# Patient Record
Sex: Male | Born: 1965 | Race: White | Hispanic: No | Smoking: Former smoker
Health system: Southern US, Community
[De-identification: ages and names within clinical notes are randomized; demographics above are authoritative.]

## PROBLEM LIST (undated history)

## (undated) DIAGNOSIS — R519 Headache, unspecified: Secondary | ICD-10-CM

## (undated) DIAGNOSIS — R51 Headache: Secondary | ICD-10-CM

## (undated) HISTORY — PX: ROTATOR CUFF REPAIR: SHX139

## (undated) HISTORY — PX: LEG SURGERY: SHX1003

## (undated) HISTORY — PX: ANTERIOR CRUCIATE LIGAMENT REPAIR: SHX115

## (undated) HISTORY — PX: HERNIA REPAIR: SHX51

---

## 2000-10-02 ENCOUNTER — Encounter: Payer: Self-pay | Admitting: Family Medicine

## 2000-10-02 ENCOUNTER — Encounter: Admission: RE | Admit: 2000-10-02 | Discharge: 2000-10-02 | Payer: Self-pay | Admitting: Family Medicine

## 2002-05-11 ENCOUNTER — Emergency Department (HOSPITAL_COMMUNITY): Admission: EM | Admit: 2002-05-11 | Discharge: 2002-05-11 | Payer: Self-pay | Admitting: Emergency Medicine

## 2002-05-11 ENCOUNTER — Encounter: Payer: Self-pay | Admitting: Emergency Medicine

## 2009-01-10 ENCOUNTER — Emergency Department (HOSPITAL_COMMUNITY): Admission: EM | Admit: 2009-01-10 | Discharge: 2009-01-10 | Payer: Self-pay | Admitting: Emergency Medicine

## 2010-11-03 ENCOUNTER — Emergency Department (HOSPITAL_COMMUNITY)
Admission: EM | Admit: 2010-11-03 | Discharge: 2010-11-03 | Payer: Self-pay | Source: Home / Self Care | Admitting: Emergency Medicine

## 2011-01-16 LAB — CBC
Hemoglobin: 15.5 g/dL (ref 13.0–17.0)
RBC: 4.91 MIL/uL (ref 4.22–5.81)
WBC: 9.7 10*3/uL (ref 4.0–10.5)

## 2011-01-16 LAB — DIFFERENTIAL
Basophils Relative: 1 % (ref 0–1)
Lymphocytes Relative: 33 % (ref 12–46)
Lymphs Abs: 3.2 10*3/uL (ref 0.7–4.0)
Monocytes Relative: 9 % (ref 3–12)
Neutro Abs: 5.3 10*3/uL (ref 1.7–7.7)
Neutrophils Relative %: 55 % (ref 43–77)

## 2011-01-16 LAB — BASIC METABOLIC PANEL
Calcium: 9.3 mg/dL (ref 8.4–10.5)
GFR calc Af Amer: >60 mL/min (ref >60)
GFR calc non Af Amer: >60 mL/min (ref >60)
Sodium: 139 mEq/L (ref 135–145)

## 2011-02-16 LAB — URINALYSIS, ROUTINE W REFLEX MICROSCOPIC
Glucose, UA: NEGATIVE mg/dL
Specific Gravity, Urine: 1.018 (ref 1.005–1.030)
Urobilinogen, UA: 0.2 mg/dL (ref 0.0–1.0)

## 2011-02-16 LAB — POCT I-STAT, CHEM 8
Glucose, Bld: 92 mg/dL (ref 70–99)
HCT: 53 % — ABNORMAL HIGH (ref 39.0–52.0)
Hemoglobin: 18 g/dL — ABNORMAL HIGH (ref 13.0–17.0)
Potassium: 4 mEq/L (ref 3.5–5.1)

## 2011-02-16 LAB — URINE MICROSCOPIC-ADD ON

## 2015-02-07 ENCOUNTER — Emergency Department (HOSPITAL_BASED_OUTPATIENT_CLINIC_OR_DEPARTMENT_OTHER): Payer: Self-pay

## 2015-02-07 ENCOUNTER — Emergency Department (HOSPITAL_BASED_OUTPATIENT_CLINIC_OR_DEPARTMENT_OTHER)
Admission: EM | Admit: 2015-02-07 | Discharge: 2015-02-07 | Disposition: A | Discharge status: Home / Self Care | Payer: Self-pay | Attending: Emergency Medicine | Admitting: Emergency Medicine

## 2015-02-07 DIAGNOSIS — N200 Calculus of kidney: Secondary | ICD-10-CM | POA: Insufficient documentation

## 2015-02-07 DIAGNOSIS — N390 Urinary tract infection, site not specified: Secondary | ICD-10-CM | POA: Insufficient documentation

## 2015-02-07 DIAGNOSIS — R109 Unspecified abdominal pain: Secondary | ICD-10-CM

## 2015-02-07 LAB — COMPREHENSIVE METABOLIC PANEL
ALT: 15 U/L (ref 0–53)
AST: 16 U/L (ref 0–37)
Albumin: 3.1 g/dL — ABNORMAL LOW (ref 3.5–5.2)
Alkaline Phosphatase: 69 U/L (ref 39–117)
Anion gap: 6 (ref 5–15)
BUN: 24 mg/dL — AB (ref 6–23)
CALCIUM: 6.9 mg/dL — AB (ref 8.4–10.5)
CO2: 19 mmol/L (ref 19–32)
Chloride: 113 mmol/L — ABNORMAL HIGH (ref 96–112)
Creatinine, Ser: 0.97 mg/dL (ref 0.50–1.35)
GLUCOSE: 84 mg/dL (ref 70–99)
Potassium: 3.2 mmol/L — ABNORMAL LOW (ref 3.5–5.1)
SODIUM: 138 mmol/L (ref 135–145)
Total Bilirubin: 0.4 mg/dL (ref 0.3–1.2)
Total Protein: 5.5 g/dL — ABNORMAL LOW (ref 6.0–8.3)

## 2015-02-07 LAB — URINALYSIS, ROUTINE W REFLEX MICROSCOPIC
Bilirubin Urine: NEGATIVE
Glucose, UA: NEGATIVE mg/dL
KETONES UR: NEGATIVE mg/dL
Nitrite: NEGATIVE
Protein, ur: NEGATIVE mg/dL
Specific Gravity, Urine: 1.021 (ref 1.005–1.030)
Urobilinogen, UA: 0.2 mg/dL (ref 0.0–1.0)
pH: 5.5 (ref 5.0–8.0)

## 2015-02-07 LAB — CBC WITH DIFFERENTIAL/PLATELET
BASOS ABS: 0 10*3/uL (ref 0.0–0.1)
BASOS PCT: 0 % (ref 0–1)
Eosinophils Absolute: 0.1 10*3/uL (ref 0.0–0.7)
Eosinophils Relative: 1 % (ref 0–5)
HCT: 35.1 % — ABNORMAL LOW (ref 39.0–52.0)
Hemoglobin: 12.2 g/dL — ABNORMAL LOW (ref 13.0–17.0)
LYMPHS ABS: 1.6 10*3/uL (ref 0.7–4.0)
LYMPHS PCT: 15 % (ref 12–46)
MCH: 32.1 pg (ref 26.0–34.0)
MCHC: 34.8 g/dL (ref 30.0–36.0)
MCV: 92.4 fL (ref 78.0–100.0)
MONO ABS: 1.1 10*3/uL — AB (ref 0.1–1.0)
MONOS PCT: 10 % (ref 3–12)
NEUTROS PCT: 74 % (ref 43–77)
Neutro Abs: 8.2 10*3/uL — ABNORMAL HIGH (ref 1.7–7.7)
PLATELETS: 196 10*3/uL (ref 150–400)
RBC: 3.8 MIL/uL — ABNORMAL LOW (ref 4.22–5.81)
RDW: 13.2 % (ref 11.5–15.5)
WBC: 11 10*3/uL — AB (ref 4.0–10.5)

## 2015-02-07 LAB — LIPASE, BLOOD: LIPASE: 24 U/L (ref 11–59)

## 2015-02-07 LAB — URINE MICROSCOPIC-ADD ON

## 2015-02-07 MED ORDER — CIPROFLOXACIN IN D5W 400 MG/200ML IV SOLN
400.0000 mg | Freq: Once | INTRAVENOUS | Status: AC
  Administered 2015-02-07: 400 mg via INTRAVENOUS
  Filled 2015-02-07: qty 200

## 2015-02-07 MED ORDER — ONDANSETRON HCL 4 MG/2ML IJ SOLN
4.0000 mg | Freq: Once | INTRAMUSCULAR | Status: AC
  Administered 2015-02-07: 4 mg via INTRAVENOUS

## 2015-02-07 MED ORDER — ONDANSETRON 8 MG PO TBDP: 8.0000 mg | ORAL_TABLET | Freq: Three times a day (TID) | ORAL | Status: DC | PRN

## 2015-02-07 MED ORDER — TAMSULOSIN HCL 0.4 MG PO CAPS: 0.4000 mg | ORAL_CAPSULE | Freq: Every day | ORAL | Status: DC

## 2015-02-07 MED ORDER — CIPROFLOXACIN HCL 500 MG PO TABS: 500.0000 mg | ORAL_TABLET | Freq: Two times a day (BID) | ORAL | Status: DC

## 2015-02-07 MED ORDER — KETOROLAC TROMETHAMINE 30 MG/ML IJ SOLN
30.0000 mg | Freq: Once | INTRAMUSCULAR | Status: AC
  Administered 2015-02-07: 30 mg via INTRAVENOUS
  Filled 2015-02-07: qty 1

## 2015-02-07 MED ORDER — ONDANSETRON HCL 4 MG/2ML IJ SOLN
4.0000 mg | Freq: Once | INTRAMUSCULAR | Status: AC
  Administered 2015-02-07: 4 mg via INTRAVENOUS
  Filled 2015-02-07: qty 2

## 2015-02-07 MED ORDER — ONDANSETRON HCL 4 MG/2ML IJ SOLN
INTRAMUSCULAR | Status: AC
  Administered 2015-02-07: 4 mg via INTRAVENOUS
  Filled 2015-02-07: qty 2

## 2015-02-07 MED ORDER — HYDROMORPHONE HCL 1 MG/ML IJ SOLN
0.5000 mg | Freq: Once | INTRAMUSCULAR | Status: AC
  Administered 2015-02-07: 0.5 mg via INTRAVENOUS
  Filled 2015-02-07: qty 1

## 2015-02-07 MED ORDER — SODIUM CHLORIDE 0.9 % IV SOLN
Freq: Once | INTRAVENOUS | Status: AC
  Administered 2015-02-07: 12:00:00 via INTRAVENOUS

## 2015-02-07 MED ORDER — OXYCODONE-ACETAMINOPHEN 5-325 MG PO TABS: 1.0000 | ORAL_TABLET | ORAL | Status: DC | PRN

## 2015-02-07 MED ORDER — HYDROMORPHONE HCL 1 MG/ML IJ SOLN
1.0000 mg | Freq: Once | INTRAMUSCULAR | Status: AC
Start: 2015-02-07 — End: 2015-02-07
  Administered 2015-02-07: 1 mg via INTRAVENOUS
  Filled 2015-02-07: qty 1

## 2015-02-07 NOTE — ED Notes (Signed)
Cipro cont on IV infusion pump

## 2015-02-07 NOTE — Discharge Instructions (Signed)
Take percocet as prescribed as needed for pain. Flomax to help pass the stone. zofran for nausea. cipro for infection. If develop high fever, vomiting, worsening pain, go to [facility] ED. Otherwise follow up with urology  Kidney Stones Kidney stones (urolithiasis) are deposits that form inside your kidneys. The intense pain is caused by the stone moving through the urinary tract. When the stone moves, the ureter goes into spasm around the stone. The stone is usually passed in the urine.  CAUSES   A disorder that makes certain neck glands produce too much parathyroid hormone (primary hyperparathyroidism).  A buildup of uric acid crystals, similar to gout in your joints.  Narrowing (stricture) of the ureter.  A kidney obstruction present at birth (congenital obstruction).  Previous surgery on the kidney or ureters.  Numerous kidney infections. SYMPTOMS   Feeling sick to your stomach (nauseous).  Throwing up (vomiting).  Blood in the urine (hematuria).  Pain that usually spreads (radiates) to the groin.  Frequency or urgency of urination. DIAGNOSIS   Taking a history and physical exam.  Blood or urine tests.  CT scan.  Occasionally, an examination of the inside of the urinary bladder (cystoscopy) is performed. TREATMENT   Observation.  Increasing your fluid intake.  Extracorporeal shock wave lithotripsy--This is a noninvasive procedure that uses shock waves to break up kidney stones.  Surgery may be needed if you have severe pain or persistent obstruction. There are various surgical procedures. Most of the procedures are performed with the use of small instruments. Only small incisions are needed to accommodate these instruments, so recovery time is minimized. The size, location, and chemical composition are all important variables that will determine the proper choice of action for you. Talk to your health care provider to better understand your situation so that you  will minimize the risk of injury to yourself and your kidney.  HOME CARE INSTRUCTIONS   Drink enough water and fluids to keep your urine clear or pale yellow. This will help you to pass the stone or stone fragments.  Strain all urine through the provided strainer. Keep all particulate matter and stones for your health care provider to see. The stone causing the pain may be as small as a grain of salt. It is very important to use the strainer each and every time you pass your urine. The collection of your stone will allow your health care provider to analyze it and verify that a stone has actually passed. The stone analysis will often identify what you can do to reduce the incidence of recurrences.  Only take over-the-counter or prescription medicines for pain, discomfort, or fever as directed by your health care provider.  Make a follow-up appointment with your health care provider as directed.  Get follow-up X-rays if required. The absence of pain does not always mean that the stone has passed. It may have only stopped moving. If the urine remains completely obstructed, it can cause loss of kidney function or even complete destruction of the kidney. It is your responsibility to make sure X-rays and follow-ups are completed. Ultrasounds of the kidney can show blockages and the status of the kidney. Ultrasounds are not associated with any radiation and can be performed easily in a matter of minutes. SEEK MEDICAL CARE IF:  You experience pain that is progressive and unresponsive to any pain medicine you have been prescribed. SEEK IMMEDIATE MEDICAL CARE IF:   Pain cannot be controlled with the prescribed medicine.  You have a  fever or shaking chills.  The severity or intensity of pain increases over 18 hours and is not relieved by pain medicine.  You develop a new onset of abdominal pain.  You feel faint or pass out.  You are unable to urinate. MAKE SURE YOU:   Understand these  instructions.  Will watch your condition.  Will get help right away if you are not doing well or get worse. Document Released: 10/23/2005 Document Revised: 06/25/2013 Document Reviewed: 03/26/2013 [facility] Patient Information 2015 [place], [place]. This information is not intended to replace advice given to you by your health care provider. Make sure you discuss any questions you have with your health care provider.

## 2015-02-07 NOTE — ED Notes (Signed)
Patient transported to CT 

## 2015-02-07 NOTE — ED Provider Notes (Signed)
CSN: [id number]     Arrival date & time 02/07/15  1100 History   First MD Initiated Contact with Patient 02/07/15 1206     No chief complaint on file.    (Consider location/radiation/quality/duration/timing/severity/associated sxs/prior Treatment) HPI LISTON THUM is a 49 y.o. male with history of kidney stones, presents to emergency department complaining of left lower abdominal pain that radiates to the left flank pain for about a week. When the pain started it was not severe, and patient was hoping it would improve. States it is only gotten worse. He reports dysuria, hematuria, chills at home. He has not been taking any medications. Patient states he is having associated nausea. Denies vomiting. Denies diarrhea. He currently does not have a urologist. He has not had a kidney stone in multiple years. States nothing that he does make symptoms better or worse.     No past medical history on file. No past surgical history on file. No family history on file. History  Substance Use Topics  . Smoking status: Not on file  . Smokeless tobacco: Not on file  . Alcohol Use: Not on file    Review of Systems  Constitutional: Positive for chills. Negative for fever.  Respiratory: Negative for cough, chest tightness and shortness of breath.   Cardiovascular: Negative for chest pain, palpitations and leg swelling.  Gastrointestinal: Positive for nausea and abdominal pain. Negative for vomiting, diarrhea and abdominal distention.  Genitourinary: Positive for dysuria, urgency, hematuria and testicular pain. Negative for frequency, penile swelling and scrotal swelling.  Musculoskeletal: Negative for myalgias, arthralgias, neck pain and neck stiffness.  Skin: Negative for rash.  Allergic/Immunologic: Negative for immunocompromised state.  Neurological: Negative for dizziness, weakness, light-headedness, numbness and headaches.  All other systems reviewed and are negative.     Allergies  Review  of patient's allergies indicates no known allergies.  Home Medications   Prior to Admission medications   Not on File   BP 158/117 mmHg  Temp(Src) 98.4 F (36.9 C) (Oral)  Resp 24  Ht 5\' 7"  (1.702 m)  Wt 190 lb (86.183 kg)  BMI 29.75 kg/m2  SpO2 98% Physical Exam  Constitutional: He appears well-developed and well-nourished. No distress.  HENT:  Head: Normocephalic and atraumatic.  Eyes: Conjunctivae are normal.  Neck: Neck supple.  Cardiovascular: Normal rate, regular rhythm and normal heart sounds.   Pulmonary/Chest: Effort normal. No respiratory distress. He has no wheezes. He has no rales.  Abdominal: Soft. Bowel sounds are normal. He exhibits no distension. There is tenderness. There is no rebound.  LLQ tenderness. Left CVA tenderness  Musculoskeletal: He exhibits no edema.  Neurological: He is alert.  Skin: Skin is warm and dry.  Nursing note and vitals reviewed.   ED Course  Procedures (including critical care time) Labs Review Labs Reviewed  URINALYSIS, ROUTINE W REFLEX MICROSCOPIC - Abnormal; Notable for the following:    Hgb urine dipstick LARGE (*)    Leukocytes, UA SMALL (*)    All other components within normal limits  URINE MICROSCOPIC-ADD ON - Abnormal; Notable for the following:    Bacteria, UA MANY (*)    Crystals CA OXALATE CRYSTALS (*)    All other components within normal limits  CBC WITH DIFFERENTIAL/PLATELET  COMPREHENSIVE METABOLIC PANEL  LIPASE, BLOOD    Imaging Review Ct Renal Stone Study  02/07/2015   CLINICAL DATA:  Acute left flank pain and hematuria. History of nephrolithiasis.  EXAM: CT ABDOMEN AND PELVIS WITHOUT CONTRAST  TECHNIQUE: Multidetector CT  imaging of the abdomen and pelvis was performed following the standard protocol without IV contrast.  COMPARISON:  11/03/2010  FINDINGS: Minor dependent bibasilar atelectasis. No pericardial or pleural effusion. No significant hiatal hernia.  Abdomen: Moderate acute obstructive left  hydroureteronephrosis with surrounding perinephric and periureteral edema. This extends to the left UVJ where there are 2 adjacent obstructing calculi 1 measuring 5 mm and a second measuring 3 mm, image 72. These are about to pass into the bladder along the posterior wall. Right kidney demonstrates no acute obstruction or right ureteral calculus.  There are punctate nonobstructing intrarenal calculi bilaterally throughout the right kidney and the left lower pole.  Liver demonstrates a stable hypodense cyst in the left hepatic dome measuring 13 mm. No other hepatic abnormality or biliary dilatation by noncontrast imaging. Gallbladder, biliary system, pancreas, spleen, and adrenal glands are within normal limits for noncontrast imaging.  Negative for bowel obstruction, dilatation, ileus, or free air.  No abdominal free fluid, fluid collection, hemorrhage, abscess, or adenopathy.  Pelvis: Mild colonic diverticulosis. Distal colon collapsed. Obstructing left UVJ calculi again demonstrated as above. No other bladder abnormality. Right UVJ unremarkable. No pelvic free fluid, fluid collection, hemorrhage, abscess, or adenopathy. Small fat containing left inguinal hernia.  No acute osseous finding. Postop changes of the proximal right femur from a previous intra medullary rod insertion.  IMPRESSION: Moderately obstructing left UVJ calculi, 1 measuring 5 mm and a second measuring 3 mm with associated left acute hydroureteronephrosis.  Punctate nonobstructing intrarenal calculi bilaterally.  Incidental left hepatic dome cyst  Minor colonic diverticulosis  Small fat containing left inguinal hernia   Electronically Signed   By: Jerilynn Mages.  Shick M.D.   On: 02/07/2015 13:28     EKG Interpretation None      MDM   Final diagnoses:  Kidney stone  UTI (lower urinary tract infection)    Patient with history of kidney stones, here with left lower quadrant pain radiating to the left flank. He reports chills at home, subjective  fevers. Reports nausea. Will check urine, labs, CT abdomen without contrast. Pain medications and antiemetics ordered    3:14 PM Pain only slightly improved after Toradol 30 mg, Dilaudid 0.5 mg IV. Will give another milligram of Dilaudid. CT as described above. I discussed these findings including concern for possible infection with urology on-call, they advised to control pain, and a biotics, Flomax, call the office tomorrow for close follow-up. Patient is afebrile here. Normal blood pressure. No evidence of sepsis at this time.  Filed Vitals:   02/07/15 1104 02/07/15 1331 02/07/15 1516 02/07/15 1601  BP: 158/117 131/86 133/75 121/80  Pulse:  60 68 63  Temp: 98.4 F (36.9 C)   98.7 F (37.1 C)  TempSrc: Oral   Oral  Resp: 24 18 16 16   Height: 5\' 7"  (1.702 m)     Weight: 190 lb (86.183 kg)     SpO2: 98% 98% 97% 96%     Jeannett Senior, PA-C 02/08/15 0048  Serita Grit, MD 02/09/15 1153

## 2015-02-07 NOTE — ED Notes (Signed)
Presents to ED to c/o "severe left lower abd pain, radiating to lt kidney", earlier this week had nausea, denies vomiting, diarrhea. Onset: approx 1 week ago, pt states noted some blood in his urine. NPO since last PM at 2000hrs, w/ solids, PO fluids this am at approx 0900hrs.

## 2015-02-08 LAB — URINE CULTURE
COLONY COUNT: NO GROWTH
CULTURE: NO GROWTH

## 2015-11-29 ENCOUNTER — Encounter (HOSPITAL_BASED_OUTPATIENT_CLINIC_OR_DEPARTMENT_OTHER): Payer: Self-pay

## 2015-11-29 ENCOUNTER — Emergency Department (HOSPITAL_BASED_OUTPATIENT_CLINIC_OR_DEPARTMENT_OTHER): Payer: BLUE CROSS/BLUE SHIELD

## 2015-11-29 ENCOUNTER — Emergency Department (HOSPITAL_BASED_OUTPATIENT_CLINIC_OR_DEPARTMENT_OTHER)
Admission: EM | Admit: 2015-11-29 | Discharge: 2015-11-29 | Disposition: A | Discharge status: Home / Self Care | Payer: BLUE CROSS/BLUE SHIELD | Attending: Emergency Medicine | Admitting: Emergency Medicine

## 2015-11-29 DIAGNOSIS — R202 Paresthesia of skin: Secondary | ICD-10-CM | POA: Insufficient documentation

## 2015-11-29 DIAGNOSIS — S4991XA Unspecified injury of right shoulder and upper arm, initial encounter: Secondary | ICD-10-CM | POA: Diagnosis not present

## 2015-11-29 DIAGNOSIS — Y9289 Other specified places as the place of occurrence of the external cause: Secondary | ICD-10-CM | POA: Diagnosis not present

## 2015-11-29 DIAGNOSIS — M25511 Pain in right shoulder: Secondary | ICD-10-CM

## 2015-11-29 DIAGNOSIS — Y998 Other external cause status: Secondary | ICD-10-CM | POA: Diagnosis not present

## 2015-11-29 DIAGNOSIS — W11XXXA Fall on and from ladder, initial encounter: Secondary | ICD-10-CM | POA: Diagnosis not present

## 2015-11-29 DIAGNOSIS — S59901A Unspecified injury of right elbow, initial encounter: Secondary | ICD-10-CM | POA: Diagnosis not present

## 2015-11-29 DIAGNOSIS — F17210 Nicotine dependence, cigarettes, uncomplicated: Secondary | ICD-10-CM | POA: Diagnosis not present

## 2015-11-29 DIAGNOSIS — Y9339 Activity, other involving climbing, rappelling and jumping off: Secondary | ICD-10-CM | POA: Diagnosis not present

## 2015-11-29 DIAGNOSIS — Z23 Encounter for immunization: Secondary | ICD-10-CM | POA: Diagnosis not present

## 2015-11-29 MED ORDER — HYDROCODONE-ACETAMINOPHEN 5-325 MG PO TABS
1.0000 | ORAL_TABLET | Freq: Once | ORAL | Status: AC
  Administered 2015-11-29: 1 via ORAL
  Filled 2015-11-29: qty 1

## 2015-11-29 MED ORDER — IBUPROFEN 600 MG PO TABS: 600.0000 mg | ORAL_TABLET | Freq: Four times a day (QID) | ORAL | Status: DC | PRN

## 2015-11-29 MED ORDER — TETANUS-DIPHTH-ACELL PERTUSSIS 5-2.5-18.5 LF-MCG/0.5 IM SUSP
0.5000 mL | Freq: Once | INTRAMUSCULAR | Status: AC
  Administered 2015-11-29: 0.5 mL via INTRAMUSCULAR
  Filled 2015-11-29: qty 0.5

## 2015-11-29 MED FILL — IBUPROFEN 600 MG TABLET: 600 | 7 days supply | Qty: 30 | Fill #0

## 2015-11-29 NOTE — ED Provider Notes (Signed)
CSN: [id number]     Arrival date & time 11/29/15  1359 History   First MD Initiated Contact with Patient 11/29/15 1412     Chief Complaint  Patient presents with  . Fall     (Consider location/radiation/quality/duration/timing/severity/associated sxs/prior Treatment) HPI Andres Howard is a 50 y.o. male because of her evaluation after a fall. Patient reports he was climbing down a ladder from the attic when the ladder broke causing him to fall approximately 6-8 feet forward. He reports landing on a piece of furniture on his right shoulder. He reports pain with range of motion of his right shoulder and elbow. Reports very mild tingling in his fingers but denies any overt numbness or weakness. Has taken 2 Motrin prior to arrival without relief of his symptoms. Pain is moderate. No other modifying factors.  History reviewed. No pertinent past medical history. Past Surgical History  Procedure Laterality Date  . Rotator cuff repair    . Anterior cruciate ligament repair    . Leg surgery     No family history on file. Social History  Substance Use Topics  . Smoking status: Current Every Day Smoker    Types: Cigarettes  . Smokeless tobacco: None  . Alcohol Use: Yes     Comment: occ    Review of Systems A 10 point review of systems was completed and was negative except for pertinent positives and negatives as mentioned in the history of present illness     Allergies  Review of patient's allergies indicates no known allergies.  Home Medications   Prior to Admission medications   Medication Sig Start Date End Date Taking? Authorizing Provider  ibuprofen (ADVIL,MOTRIN) 600 MG tablet Take 1 tablet (600 mg total) by mouth every 6 (six) hours as needed. 11/29/15   Cozetta Seif, PA-C   BP 144/90 mmHg  Pulse 88  Temp(Src) 98.1 F (36.7 C) (Oral)  Resp 20  Ht 5\' 7"  (1.702 m)  Wt 79.379 kg  BMI 27.40 kg/m2  SpO2 100% Physical Exam  Constitutional:  Awake, alert, nontoxic  appearance.  HENT:  Head: Atraumatic.  Eyes: Right eye exhibits no discharge. Left eye exhibits no discharge.  Neck: Normal range of motion. Neck supple.  Cardiovascular: Intact distal pulses.   Pulmonary/Chest: Effort normal. He exhibits no tenderness.  Abdominal: Soft. There is no tenderness. There is no rebound.  Musculoskeletal:  Patient has tenderness throughout right shoulder with no bony crepitus. No overt warmth, redness. Mild tenderness to right olecranon. No other abnormalities noted. Patient maintains passive range of motion but active range of motion is decreased secondary to pain. sensation is intact to light touch.  Neurological:  Mental status and motor strength appears baseline for patient and situation.  Skin: No rash noted.  Skin tear noted to left forearm. Approximately 1 inch in diameter  Psychiatric: He has a normal mood and affect.  Nursing note and vitals reviewed.   ED Course  Procedures (including critical care time) Labs Review Labs Reviewed - No data to display  Imaging Review Dg Shoulder Right  11/29/2015  CLINICAL DATA:  Fall this morning 8 feet from ladder. Right shoulder and elbow pain. EXAM: RIGHT SHOULDER - 2+ VIEW COMPARISON:  None. FINDINGS: Moderate degenerative changes in the right [facility] joint with early degenerative changes in the glenohumeral joint. No acute bony abnormality. Specifically, no fracture, subluxation, or dislocation. Soft tissues are intact. IMPRESSION: No acute bony abnormality. Electronically Signed   By: Rolm Baptise M.D.   On:  11/29/2015 14:55   Dg Elbow Complete Right  11/29/2015  CLINICAL DATA:  Fall this morning 8 feet from ladder. Right elbow pain. EXAM: RIGHT ELBOW - COMPLETE 3+ VIEW COMPARISON:  None. FINDINGS: There is no evidence of fracture, dislocation, or joint effusion. There is no evidence of arthropathy or other focal bone abnormality. Soft tissues are unremarkable. IMPRESSION: Negative. Electronically Signed   By: Rolm Baptise M.D.   On: 11/29/2015 14:56   I have personally reviewed and evaluated these images and lab results as part of my medical decision-making.   EKG Interpretation None     Meds given in ED:  Medications  HYDROcodone-acetaminophen (NORCO/VICODIN) 5-325 MG per tablet 1 tablet (1 tablet Oral Given 11/29/15 1451)  Tdap (BOOSTRIX) injection 0.5 mL (0.5 mLs Intramuscular Given 11/29/15 1543)    Discharge Medication List as of 11/29/2015  3:46 PM    START taking these medications   Details  ibuprofen (ADVIL,MOTRIN) 600 MG tablet Take 1 tablet (600 mg total) by mouth every 6 (six) hours as needed., Starting 11/29/2015, Until Discontinued, Print       Filed Vitals:   11/29/15 1407 11/29/15 1556  BP: 146/90 144/90  Pulse: 74 88  Temp: 98.1 F (36.7 C)   TempSrc: Oral   Resp: 18 20  Height: 5\' 7"  (1.702 m)   Weight: 79.379 kg   SpO2: 100% 100%    MDM  Andres Howard is a 50 y.o. male here for evaluation of right shoulder pain after falling down an attic ladder. Patient is neurovascularly intact. Diffuse pain throughout right shoulder, maintains passive range of motion, active range of motion decreased secondary to pain. Sensation is intact to light touch. No evidence of hemarthrosis. X-rays are negative. Placed in a foam arm sling, given anti-inflammatories. Also has skin tear noted to left forearm, tetanus updated in the ED-bacitracin dressing applied. Will follow up with PCP as needed. No evidence of other acute emergent pathology. The patient appears reasonably screened and/or stabilized for discharge and I doubt any other medical condition or other [facility] requiring further screening, evaluation, or treatment in the ED at this time prior to discharge.   Final diagnoses:  Right shoulder pain        Comer Locket, PA-C 11/29/15 [place], DO 11/30/15 1736

## 2015-11-29 NOTE — ED Notes (Signed)
Fell approx 33ft off ladder-pain to right shoulder,left LE, skin tear to left FA-NAD-steady gait

## 2015-11-29 NOTE — Discharge Instructions (Signed)
You were evaluated in the ED today for your shoulder pain after fall. Your x-ray was negative for any broken bones or dislocations. Please take your medications as we discussed. Follow up with your doctor as needed for reevaluation. Return to ED for any new or worsening symptoms.  Joint Pain Joint pain, which is also called arthralgia, can be caused by many things. Joint pain often goes away when you follow your health care provider's instructions for relieving pain at home. However, joint pain can also be caused by conditions that require further treatment. Common causes of joint pain include:  Bruising in the area of the joint.  Overuse of the joint.  Wear and tear on the joints that occur with aging (osteoarthritis).  Various other forms of arthritis.  A buildup of a crystal form of uric acid in the joint (gout).  Infections of the joint (septic arthritis) or of the bone (osteomyelitis). Your health care provider may recommend medicine to help with the pain. If your joint pain continues, additional tests may be needed to diagnose your condition. HOME CARE INSTRUCTIONS Watch your condition for any changes. Follow these instructions as directed to lessen the pain that you are feeling.  Take medicines only as directed by your health care provider.  Rest the affected area for as long as your health care provider says that you should. If directed to do so, raise the painful joint above the level of your heart while you are sitting or lying down.  Do not do things that cause or worsen pain.  If directed, apply ice to the painful area:  Put ice in a plastic bag.  Place a towel between your skin and the bag.  Leave the ice on for 20 minutes, 2-3 times per day.  Wear an elastic bandage, splint, or sling as directed by your health care provider. Loosen the elastic bandage or splint if your fingers or toes become numb and tingle, or if they turn cold and blue.  Begin exercising or  stretching the affected area as directed by your health care provider. Ask your health care provider what types of exercise are safe for you.  Keep all follow-up visits as directed by your health care provider. This is important. SEEK MEDICAL CARE IF:  Your pain increases, and medicine does not help.  Your joint pain does not improve within 3 days.  You have increased bruising or swelling.  You have a fever.  You lose 10 lb (4.5 kg) or more without trying. SEEK IMMEDIATE MEDICAL CARE IF:  You are not able to move the joint.  Your fingers or toes become numb or they turn cold and blue.   This information is not intended to replace advice given to you by your health care provider. Make sure you discuss any questions you have with your health care provider.   Document Released: 10/23/2005 Document Revised: 11/13/2014 Document Reviewed: 08/04/2014 Elsevier Interactive Patient Education [employer].

## 2015-11-29 NOTE — ED Notes (Signed)
Pt with skin tear to left arm, cleansed with normal saline, bacitracin applied, covered with 4 x 4

## 2016-01-04 ENCOUNTER — Other Ambulatory Visit: Payer: Self-pay | Admitting: Orthopedic Surgery

## 2016-01-10 ENCOUNTER — Encounter (HOSPITAL_COMMUNITY): Payer: Self-pay

## 2016-01-10 ENCOUNTER — Encounter (HOSPITAL_COMMUNITY)
Admission: RE | Admit: 2016-01-10 | Discharge: 2016-01-10 | Disposition: A | Discharge status: Home / Self Care | Payer: BLUE CROSS/BLUE SHIELD | Source: Ambulatory Visit | Attending: Orthopedic Surgery | Admitting: Orthopedic Surgery

## 2016-01-10 ENCOUNTER — Other Ambulatory Visit (HOSPITAL_COMMUNITY): Payer: Self-pay | Admitting: *Deleted

## 2016-01-10 DIAGNOSIS — Z01812 Encounter for preprocedural laboratory examination: Secondary | ICD-10-CM | POA: Diagnosis present

## 2016-01-10 DIAGNOSIS — M75101 Unspecified rotator cuff tear or rupture of right shoulder, not specified as traumatic: Secondary | ICD-10-CM | POA: Insufficient documentation

## 2016-01-10 HISTORY — DX: Headache, unspecified: R51.9

## 2016-01-10 HISTORY — DX: Headache: R51

## 2016-01-10 LAB — CBC
HCT: 46.9 % (ref 39.0–52.0)
Hemoglobin: 16.4 g/dL (ref 13.0–17.0)
MCH: 31.6 pg (ref 26.0–34.0)
MCHC: 35 g/dL (ref 30.0–36.0)
MCV: 90.4 fL (ref 78.0–100.0)
PLATELETS: 258 10*3/uL (ref 150–400)
RBC: 5.19 MIL/uL (ref 4.22–5.81)
RDW: 13.2 % (ref 11.5–15.5)
WBC: 6.8 10*3/uL (ref 4.0–10.5)

## 2016-01-10 NOTE — Pre-Procedure Instructions (Signed)
    Andres Howard  01/10/2016      [id number] - [address], [address] [place] [address] Phone: [phone] Fax: [phone]    Your procedure is scheduled on Friday, January 14, 2016 at 7:30 AM   Report to [facility] Entrance "A" Admitting Office at 5:30 AM.   Call this number if you have problems the morning of surgery: [phone]   Any questions prior to day of surgery, please call [phone] between 8 & 4 PM.   Remember:  Do not eat food or drink liquids after midnight Thursday, 01/13/16.  Take these medicines the morning of surgery with A SIP OF WATER: Oxycodone - if need   Do not wear jewelry.  Do not wear lotions, powders, or .  You may NOT wear deodorant.  Men may shave face and neck.  Do not bring valuables to the hospital.  [facility] is not responsible for any belongings or valuables.  Contacts, dentures or bridgework may not be worn into surgery.  Leave your suitcase in the car.  After surgery it may be brought to your room.  For patients admitted to the hospital, discharge time will be determined by your treatment team.  Patients discharged the day of surgery will not be allowed to drive home.   Special instructions:  See "Preparing for Surgery" Instruction sheet.   Please read over the following fact sheets that you were given. Pain Booklet, Coughing and Deep Breathing and Surgical Site Infection Prevention

## 2016-01-13 MED ORDER — CEFAZOLIN SODIUM-DEXTROSE 2-3 GM-% IV SOLR
2.0000 g | INTRAVENOUS | Status: AC
  Administered 2016-01-14: 3 g via INTRAVENOUS
  Filled 2016-01-13: qty 50

## 2016-01-13 NOTE — H&P (Signed)
Andres Howard is an 50 y.o. male.   Chief Complaint: Right shoulder pain HPI: Andres Howard is a 50 year old patient with right shoulder pain. Had a fall several months ago where he injured his shoulder. Subsequent MRI scanning demonstrates rotator cuff tear as well as biceps tendon tearing. 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 He does describe weakness localized pain to the deltoid aspect of his right shoulder with pain radiating down the biceps area as well. Has difficulty weakness with overhead motion no family history of DVT or pulmonary embolism  Past Medical History  Diagnosis Date  . Headache     Past Surgical History  Procedure Laterality Date  . Rotator cuff repair    . Anterior cruciate ligament repair    . Leg surgery    . Hernia repair      No family history on file. Social History:  reports that he has been smoking Cigarettes.  He has a 10 pack-year smoking history. He does not have any smokeless tobacco history on file. He reports that he drinks alcohol. He reports that he does not use illicit drugs.  Allergies: No Known Allergies  No prescriptions prior to admission    No results found for this or any previous visit (from the past 48 hour(s)). No results found.  Review of Systems  Constitutional: Negative.   HENT: Negative.   Eyes: Negative.   Respiratory: Negative.   Cardiovascular: Negative.   Gastrointestinal: Negative.   Genitourinary: Negative.   Musculoskeletal: Positive for joint pain.  Skin: Negative.   Neurological: Negative.   Endo/Heme/Allergies: Negative.   Psychiatric/Behavioral: Negative.     There were no vitals taken for this visit. Physical Exam  Constitutional: He appears well-developed.  HENT:  Head: Normocephalic.  Eyes: Pupils are equal, round, and reactive to light.  Neck: Normal range of motion.  Cardiovascular: Normal rate.   Respiratory: Effort normal.   Neurological: He is alert.  Skin: Skin is warm.  Psychiatric: He has a normal mood and affect.   examination the right shoulder demonstrates weakness to supraspinatus M status testing positive O'Brien's testing no real tenderness to before meals joint palpation on the right or left hand side neck range of motion is full radial pulses intact course grinding and crepitus is present with passive and active range of motion of the right shoulder not present on the left shoulder O'Brien's testing positive on the right he has good strength to subscap testing and to some degree of status testing but symptoms supraspinatus testing is weak   Assessment/Plan Impression is right shoulder rotator cuff tear impingement with unfavorable acromial anatomy biceps tendon tearing plan arthroscopy and labral debridement biceps tendon release mini open rotator cuff tear repair in biceps tenodesis he does have arthritis in the before meals joint but this is pretty symmetric bilaterally and not really symptomatic. Patient options and risks and benefits of operative intervention including but limited to shoulder stiffness failure of the repair as well as prolonged recovery time. All questions answered. 2 plan to use CPM machine postop  to prevent frozen shoulder  Meredith Pel, MD 01/13/2016, 1:40 PM

## 2016-01-14 ENCOUNTER — Ambulatory Visit (HOSPITAL_COMMUNITY): Payer: BLUE CROSS/BLUE SHIELD | Admitting: Anesthesiology

## 2016-01-14 ENCOUNTER — Encounter (HOSPITAL_COMMUNITY): Payer: Self-pay | Admitting: *Deleted

## 2016-01-14 ENCOUNTER — Ambulatory Visit (HOSPITAL_COMMUNITY)
Admission: RE | Admit: 2016-01-14 | Discharge: 2016-01-14 | Disposition: A | Discharge status: Home / Self Care | Payer: BLUE CROSS/BLUE SHIELD | Source: Ambulatory Visit | Attending: Orthopedic Surgery | Admitting: Orthopedic Surgery

## 2016-01-14 ENCOUNTER — Encounter (HOSPITAL_COMMUNITY)
Admission: RE | Disposition: A | Discharge status: Home / Self Care | Payer: Self-pay | Source: Ambulatory Visit | Attending: Orthopedic Surgery

## 2016-01-14 DIAGNOSIS — M7541 Impingement syndrome of right shoulder: Secondary | ICD-10-CM | POA: Insufficient documentation

## 2016-01-14 DIAGNOSIS — M75101 Unspecified rotator cuff tear or rupture of right shoulder, not specified as traumatic: Secondary | ICD-10-CM | POA: Diagnosis present

## 2016-01-14 DIAGNOSIS — M7551 Bursitis of right shoulder: Secondary | ICD-10-CM | POA: Insufficient documentation

## 2016-01-14 DIAGNOSIS — F17210 Nicotine dependence, cigarettes, uncomplicated: Secondary | ICD-10-CM | POA: Insufficient documentation

## 2016-01-14 DIAGNOSIS — W19XXXA Unspecified fall, initial encounter: Secondary | ICD-10-CM | POA: Diagnosis not present

## 2016-01-14 HISTORY — PX: SHOULDER ARTHROSCOPY WITH ROTATOR CUFF REPAIR AND SUBACROMIAL DECOMPRESSION: SHX5686

## 2016-01-14 SURGERY — SHOULDER ARTHROSCOPY WITH ROTATOR CUFF REPAIR AND SUBACROMIAL DECOMPRESSION
Anesthesia: Regional | Site: Shoulder | Laterality: Right

## 2016-01-14 MED ORDER — FENTANYL CITRATE (PF) 250 MCG/5ML IJ SOLN
INTRAMUSCULAR | Status: AC
  Filled 2016-01-14: qty 5

## 2016-01-14 MED ORDER — ROCURONIUM BROMIDE 50 MG/5ML IV SOLN
INTRAVENOUS | Status: AC
  Filled 2016-01-14: qty 1

## 2016-01-14 MED ORDER — PROMETHAZINE HCL 25 MG/ML IJ SOLN: 6.2500 mg | INTRAMUSCULAR | Status: DC | PRN

## 2016-01-14 MED ORDER — ONDANSETRON HCL 4 MG/2ML IJ SOLN
INTRAMUSCULAR | Status: DC | PRN
  Administered 2016-01-14: 4 mg via INTRAVENOUS

## 2016-01-14 MED ORDER — ROCURONIUM BROMIDE 100 MG/10ML IV SOLN
INTRAVENOUS | Status: DC | PRN
  Administered 2016-01-14: 20 mg via INTRAVENOUS
  Administered 2016-01-14: 40 mg via INTRAVENOUS
  Administered 2016-01-14 (×2): 10 mg via INTRAVENOUS

## 2016-01-14 MED ORDER — OXYCODONE-ACETAMINOPHEN 5-325 MG PO TABS: 1.0000 | ORAL_TABLET | Freq: Four times a day (QID) | ORAL | Status: DC | PRN

## 2016-01-14 MED ORDER — PROPOFOL 10 MG/ML IV BOLUS
INTRAVENOUS | Status: DC | PRN
  Administered 2016-01-14: 50 mg via INTRAVENOUS
  Administered 2016-01-14 (×3): 10 mg via INTRAVENOUS
  Administered 2016-01-14: 150 mg via INTRAVENOUS

## 2016-01-14 MED ORDER — SODIUM CHLORIDE 0.9 % IR SOLN
Status: DC | PRN
  Administered 2016-01-14: 3000 mL

## 2016-01-14 MED ORDER — DEXAMETHASONE SODIUM PHOSPHATE 10 MG/ML IJ SOLN
INTRAMUSCULAR | Status: AC
  Filled 2016-01-14: qty 1

## 2016-01-14 MED ORDER — MIDAZOLAM HCL 5 MG/5ML IJ SOLN
INTRAMUSCULAR | Status: DC | PRN
  Administered 2016-01-14: 2 mg via INTRAVENOUS

## 2016-01-14 MED ORDER — CEFAZOLIN SODIUM-DEXTROSE 2-3 GM-% IV SOLR
INTRAVENOUS | Status: AC
  Filled 2016-01-14: qty 50

## 2016-01-14 MED ORDER — EPINEPHRINE HCL 1 MG/ML IJ SOLN
INTRAMUSCULAR | Status: DC | PRN
  Administered 2016-01-14: .1 mL via INTRAMUSCULAR

## 2016-01-14 MED ORDER — SODIUM CHLORIDE 0.9 % IJ SOLN
INTRAMUSCULAR | Status: DC | PRN
Start: 2016-01-14 — End: 2016-01-14
  Administered 2016-01-14: 30 mL via INTRAVENOUS

## 2016-01-14 MED ORDER — NEOSTIGMINE METHYLSULFATE 10 MG/10ML IV SOLN
INTRAVENOUS | Status: DC | PRN
  Administered 2016-01-14: 3 mg via INTRAVENOUS

## 2016-01-14 MED ORDER — DEXAMETHASONE SODIUM PHOSPHATE 4 MG/ML IJ SOLN
INTRAMUSCULAR | Status: DC | PRN
  Administered 2016-01-14: 4 mg via INTRAVENOUS

## 2016-01-14 MED ORDER — PHENYLEPHRINE HCL 10 MG/ML IJ SOLN
10.0000 mg | INTRAVENOUS | Status: DC | PRN
  Administered 2016-01-14: 15 ug/min via INTRAVENOUS

## 2016-01-14 MED ORDER — LACTATED RINGERS IV SOLN
INTRAVENOUS | Status: DC | PRN
  Administered 2016-01-14 (×2): via INTRAVENOUS

## 2016-01-14 MED ORDER — EPHEDRINE SULFATE 50 MG/ML IJ SOLN
INTRAMUSCULAR | Status: DC | PRN
  Administered 2016-01-14 (×4): 10 mg via INTRAVENOUS

## 2016-01-14 MED ORDER — PROPOFOL 10 MG/ML IV BOLUS
INTRAVENOUS | Status: AC
  Filled 2016-01-14: qty 20

## 2016-01-14 MED ORDER — OXYCODONE-ACETAMINOPHEN 5-325 MG PO TABS
2.0000 | ORAL_TABLET | Freq: Once | ORAL | Status: AC
  Administered 2016-01-14: 2 via ORAL

## 2016-01-14 MED ORDER — SUCCINYLCHOLINE CHLORIDE 20 MG/ML IJ SOLN
INTRAMUSCULAR | Status: AC
  Filled 2016-01-14: qty 1

## 2016-01-14 MED ORDER — STERILE WATER FOR INJECTION IJ SOLN
INTRAMUSCULAR | Status: AC
  Filled 2016-01-14: qty 10

## 2016-01-14 MED ORDER — FENTANYL CITRATE (PF) 100 MCG/2ML IJ SOLN
25.0000 ug | INTRAMUSCULAR | Status: DC | PRN
  Administered 2016-01-14: 25 ug via INTRAVENOUS
  Administered 2016-01-14: 50 ug via INTRAVENOUS
  Administered 2016-01-14: 25 ug via INTRAVENOUS

## 2016-01-14 MED ORDER — OXYCODONE-ACETAMINOPHEN 5-325 MG PO TABS
ORAL_TABLET | ORAL | Status: AC
  Filled 2016-01-14: qty 2

## 2016-01-14 MED ORDER — FENTANYL CITRATE (PF) 100 MCG/2ML IJ SOLN
INTRAMUSCULAR | Status: AC
  Filled 2016-01-14: qty 2

## 2016-01-14 MED ORDER — GLYCOPYRROLATE 0.2 MG/ML IJ SOLN
INTRAMUSCULAR | Status: DC | PRN
  Administered 2016-01-14: .5 mg via INTRAVENOUS

## 2016-01-14 MED ORDER — NEOSTIGMINE METHYLSULFATE 10 MG/10ML IV SOLN
INTRAVENOUS | Status: AC
  Filled 2016-01-14: qty 1

## 2016-01-14 MED ORDER — CHLORHEXIDINE GLUCONATE 4 % EX LIQD: 60.0000 mL | Freq: Once | CUTANEOUS | Status: DC

## 2016-01-14 MED ORDER — SUCCINYLCHOLINE CHLORIDE 20 MG/ML IJ SOLN
INTRAMUSCULAR | Status: DC | PRN
  Administered 2016-01-14: 80 mg via INTRAVENOUS

## 2016-01-14 MED ORDER — BUPIVACAINE-EPINEPHRINE (PF) 0.5% -1:200000 IJ SOLN
INTRAMUSCULAR | Status: DC | PRN
  Administered 2016-01-14: 25 mL via PERINEURAL

## 2016-01-14 MED ORDER — LIDOCAINE HCL (CARDIAC) 20 MG/ML IV SOLN
INTRAVENOUS | Status: AC
  Filled 2016-01-14: qty 5

## 2016-01-14 MED ORDER — GLYCOPYRROLATE 0.2 MG/ML IJ SOLN
INTRAMUSCULAR | Status: AC
  Filled 2016-01-14: qty 3

## 2016-01-14 MED ORDER — FENTANYL CITRATE (PF) 100 MCG/2ML IJ SOLN
INTRAMUSCULAR | Status: DC | PRN
  Administered 2016-01-14: 50 ug via INTRAVENOUS

## 2016-01-14 MED ORDER — PHENYLEPHRINE HCL 10 MG/ML IJ SOLN
INTRAMUSCULAR | Status: DC | PRN
  Administered 2016-01-14: 80 ug via INTRAVENOUS
  Administered 2016-01-14 (×10): 40 ug via INTRAVENOUS
  Administered 2016-01-14: 80 ug via INTRAVENOUS

## 2016-01-14 MED ORDER — EPHEDRINE SULFATE 50 MG/ML IJ SOLN
INTRAMUSCULAR | Status: AC
  Filled 2016-01-14: qty 1

## 2016-01-14 MED ORDER — PHENYLEPHRINE 40 MCG/ML (10ML) SYRINGE FOR IV PUSH (FOR BLOOD PRESSURE SUPPORT)
PREFILLED_SYRINGE | INTRAVENOUS | Status: AC
  Filled 2016-01-14: qty 10

## 2016-01-14 MED ORDER — LIDOCAINE HCL (CARDIAC) 20 MG/ML IV SOLN
INTRAVENOUS | Status: DC | PRN
  Administered 2016-01-14: 100 mg via INTRAVENOUS

## 2016-01-14 MED ORDER — EPINEPHRINE HCL 1 MG/ML IJ SOLN
INTRAMUSCULAR | Status: AC
  Filled 2016-01-14: qty 1

## 2016-01-14 MED ORDER — MIDAZOLAM HCL 2 MG/2ML IJ SOLN
INTRAMUSCULAR | Status: AC
  Filled 2016-01-14: qty 2

## 2016-01-14 SURGICAL SUPPLY — 70 items
ANCHOR CORKSCREW BIO 5.5 FT (Anchor) ×6 IMPLANT
BENZOIN TINCTURE PRP APPL 2/3 (GAUZE/BANDAGES/DRESSINGS) IMPLANT
BLADE CUTTER GATOR 3.5 (BLADE) ×3 IMPLANT
BLADE GREAT WHITE 4.2 (BLADE) ×2 IMPLANT
BLADE GREAT WHITE 4.2MM (BLADE) ×1
BLADE SURG 11 STRL SS (BLADE) ×3 IMPLANT
BUR OVAL 6.0 (BURR) ×3 IMPLANT
CLOSURE WOUND 1/2 X4 (GAUZE/BANDAGES/DRESSINGS) ×1
COVER SURGICAL LIGHT HANDLE (MISCELLANEOUS) ×3 IMPLANT
DRAPE INCISE IOBAN 66X45 STRL (DRAPES) ×6 IMPLANT
DRAPE STERI 35X30 U-POUCH (DRAPES) ×3 IMPLANT
DRAPE U-SHAPE 47X51 STRL (DRAPES) ×6 IMPLANT
DRSG PAD ABDOMINAL 8X10 ST (GAUZE/BANDAGES/DRESSINGS) ×9 IMPLANT
DURAPREP 26ML APPLICATOR (WOUND CARE) ×3 IMPLANT
ELECT REM PT RETURN 9FT ADLT (ELECTROSURGICAL) ×3
ELECTRODE REM PT RTRN 9FT ADLT (ELECTROSURGICAL) ×1 IMPLANT
FILTER STRAW FLUID ASPIR (MISCELLANEOUS) ×3 IMPLANT
GAUZE SPONGE 4X4 12PLY STRL (GAUZE/BANDAGES/DRESSINGS) ×3 IMPLANT
GAUZE XEROFORM 1X8 LF (GAUZE/BANDAGES/DRESSINGS) IMPLANT
GLOVE BIOGEL PI IND STRL 7.5 (GLOVE) ×1 IMPLANT
GLOVE BIOGEL PI IND STRL 8 (GLOVE) ×1 IMPLANT
GLOVE BIOGEL PI INDICATOR 7.5 (GLOVE) ×2
GLOVE BIOGEL PI INDICATOR 8 (GLOVE) ×2
GLOVE ECLIPSE 7.0 STRL STRAW (GLOVE) ×3 IMPLANT
GLOVE SURG ORTHO 8.0 STRL STRW (GLOVE) ×3 IMPLANT
GOWN STRL REUS W/ TWL LRG LVL3 (GOWN DISPOSABLE) IMPLANT
GOWN STRL REUS W/ TWL XL LVL3 (GOWN DISPOSABLE) ×2 IMPLANT
GOWN STRL REUS W/TWL LRG LVL3 (GOWN DISPOSABLE)
GOWN STRL REUS W/TWL XL LVL3 (GOWN DISPOSABLE) ×4
IMMOBILIZER SHOULDER FOAM XLGE (SOFTGOODS) ×3 IMPLANT
KIT BASIN OR (CUSTOM PROCEDURE TRAY) ×3 IMPLANT
KIT BIO-TENODESIS 3X8 DISP (MISCELLANEOUS) ×2
KIT INSRT BABSR STRL DISP BTN (MISCELLANEOUS) ×1 IMPLANT
KIT ROOM TURNOVER OR (KITS) ×3 IMPLANT
MANIFOLD NEPTUNE II (INSTRUMENTS) ×3 IMPLANT
NDL SUT 6 .5 CRC .975X.05 MAYO (NEEDLE) ×1 IMPLANT
NEEDLE HYPO 25X1 1.5 SAFETY (NEEDLE) ×3 IMPLANT
NEEDLE MAYO TAPER (NEEDLE) ×2
NEEDLE SCORPION MULTI FIRE (NEEDLE) ×9 IMPLANT
NEEDLE SPNL 18GX3.5 QUINCKE PK (NEEDLE) ×3 IMPLANT
NS IRRIG 1000ML POUR BTL (IV SOLUTION) ×3 IMPLANT
PACK SHOULDER (CUSTOM PROCEDURE TRAY) ×3 IMPLANT
PAD ARMBOARD 7.5X6 YLW CONV (MISCELLANEOUS) ×6 IMPLANT
PUSHLOCK PEEK 4.5X24 (Orthopedic Implant) ×6 IMPLANT
RESTRAINT HEAD UNIVERSAL NS (MISCELLANEOUS) ×3 IMPLANT
SCREW TENODESIS BIOCOMP 7MM (Screw) ×3 IMPLANT
SET ARTHROSCOPY TUBING (MISCELLANEOUS) ×2
SET ARTHROSCOPY TUBING LN (MISCELLANEOUS) ×1 IMPLANT
SLING ARM IMMOBILIZER MED (SOFTGOODS) IMPLANT
SPONGE LAP 4X18 X RAY DECT (DISPOSABLE) IMPLANT
STRIP CLOSURE SKIN 1/2X4 (GAUZE/BANDAGES/DRESSINGS) ×2 IMPLANT
SUCTION FRAZIER HANDLE 10FR (MISCELLANEOUS) ×2
SUCTION TUBE FRAZIER 10FR DISP (MISCELLANEOUS) ×1 IMPLANT
SUT ETHILON 3 0 PS 1 (SUTURE) ×3 IMPLANT
SUT FIBERWIRE #2 38 T-5 BLUE (SUTURE)
SUT PROLENE 3 0 PS 2 (SUTURE) ×3 IMPLANT
SUT VIC AB 0 CT1 27 (SUTURE) ×2
SUT VIC AB 0 CT1 27XBRD ANBCTR (SUTURE) ×1 IMPLANT
SUT VIC AB 1 CT1 27 (SUTURE) ×2
SUT VIC AB 1 CT1 27XBRD ANBCTR (SUTURE) ×1 IMPLANT
SUT VIC AB 2-0 CT1 27 (SUTURE) ×2
SUT VIC AB 2-0 CT1 TAPERPNT 27 (SUTURE) ×1 IMPLANT
SUT VICRYL 0 UR6 27IN ABS (SUTURE) ×9 IMPLANT
SUTURE FIBERWR #2 38 T-5 BLUE (SUTURE) IMPLANT
SYR 20CC LL (SYRINGE) ×6 IMPLANT
SYR TB 1ML LUER SLIP (SYRINGE) ×3 IMPLANT
TOWEL OR 17X24 6PK STRL BLUE (TOWEL DISPOSABLE) ×3 IMPLANT
TOWEL OR 17X26 10 PK STRL BLUE (TOWEL DISPOSABLE) ×3 IMPLANT
WAND HAND CNTRL MULTIVAC 90 (MISCELLANEOUS) ×3 IMPLANT
WATER STERILE IRR 1000ML POUR (IV SOLUTION) IMPLANT

## 2016-01-14 NOTE — Anesthesia Procedure Notes (Addendum)
Anesthesia Regional Block:  Interscalene brachial plexus block  Pre-Anesthetic Checklist: ,, timeout performed, Correct Patient, Correct Site, Correct Laterality, Correct Procedure, Correct Position, site marked, Risks and benefits discussed,  Surgical consent,  Pre-op evaluation,  At surgeon's request and post-op pain management  Laterality: Right  Prep: chloraprep       Needles:  Injection technique: Single-shot  Needle Type: Echogenic Stimulator Needle     Needle Length: 9cm 9 cm Needle Gauge: 21 and 21 G    Additional Needles:  Procedures: ultrasound guided (picture in chart) Interscalene brachial plexus block Narrative:  Injection made incrementally with aspirations every 5 mL.  Performed by: Personally  Anesthesiologist: JUDD, BENJAMIN  Additional Notes: Risks, benefits and alternative to block explained extensively.  Patient tolerated procedure well, without complications.   Procedure Name: Intubation Date/Time: 01/14/2016 7:37 AM Performed by: Adalberto Ill Pre-anesthesia Checklist: Patient identified, Emergency Drugs available, Suction available, Patient being monitored and Timeout performed Patient Re-evaluated:Patient Re-evaluated prior to inductionOxygen Delivery Method: Circle system utilized Preoxygenation: Pre-oxygenation with 100% oxygen Intubation Type: IV induction Ventilation: Mask ventilation without difficulty Laryngoscope Size: Miller and 2 Grade View: Grade I Tube type: Oral Tube size: 7.0 mm Number of attempts: 1 Placement Confirmation: ETT inserted through vocal cords under direct vision,  positive ETCO2 and breath sounds checked- equal and bilateral Secured at: 23 cm Tube secured with: Tape Dental Injury: Teeth and Oropharynx as per pre-operative assessment

## 2016-01-14 NOTE — Brief Op Note (Signed)
01/14/2016  10:31 AM  PATIENT:  Andres Howard  50 y.o. male  PRE-OPERATIVE DIAGNOSIS:  right shoulder rotator cuff tear biceps tearing bursitis  POST-OPERATIVE DIAGNOSIS:  same  PROCEDURE:  Procedure(s): RIGHT SHOULDER ARTHROSCOPY WITH SUBACROMIAL DECOMPRESSION AND MINI OPEN ROTATOR CUFF REPAIR, BICEPS TENODESIS  SURGEON:  Surgeon(s): Meredith Pel, MD  ASSISTANT: Laure Kidney RNFA  ANESTHESIA:   regional and general  EBL: 25 ml    Total I/O In: 1000 [I.V.:1000] Out: 50 [Blood:50]  BLOOD ADMINISTERED: none  DRAINS: none   LOCAL MEDICATIONS USED:  none  SPECIMEN:  No Specimen  COUNTS:  YES  TOURNIQUET:  * No tourniquets in log *  DICTATION: .Other Dictation: Dictation Number [id number]  PLAN OF CARE: Discharge to home after PACU  PATIENT DISPOSITION:  PACU - hemodynamically stable

## 2016-01-14 NOTE — Transfer of Care (Signed)
Immediate Anesthesia Transfer of Care Note  Patient: Andres Howard  Procedure(s) Performed: Procedure(s): RIGHT SHOULDER ARTHROSCOPY WITH SUBACROMIAL DECOMPRESSION AND MINI OPEN ROTATOR CUFF REPAIR, BICEPS TENODESIS (Right)  Patient Location: PACU  Anesthesia Type:General and GA combined with regional for post-op pain  Level of Consciousness: awake, alert  and oriented  Airway & Oxygen Therapy: Patient Spontanous Breathing and Patient connected to nasal cannula oxygen  Post-op Assessment: Report given to RN and Post -op Vital signs reviewed and stable  Post vital signs: Reviewed and stable  Last Vitals:  Filed Vitals:   01/14/16 0616  BP: 118/84  Pulse: 66  Temp: [id number] C    Complications: No apparent anesthesia complications

## 2016-01-14 NOTE — Interval H&P Note (Signed)
History and Physical Interval Note:  01/14/2016 7:26 AM  Andres Howard  has presented today for surgery, with the diagnosis of right shoulder rotator cuff tear  The various methods of treatment have been discussed with the patient and family. After consideration of risks, benefits and other options for treatment, the patient has consented to  Procedure(s): RIGHT SHOULDER ARTHROSCOPY WITH SUBACROMIAL DECOMPRESSION AND MINI OPEN ROTATOR CUFF REPAIR, BICEPS TENODESIS (Right) as a surgical intervention .  The patient's history has been reviewed, patient examined, no change in status, stable for surgery.  I have reviewed the patient's chart and labs.  Questions were answered to the patient's satisfaction.     Dallis Darden SCOTT

## 2016-01-14 NOTE — Op Note (Signed)
NAME:  Andres, Howard NO.:  [id number]  MEDICAL RECORD NO.:  [id number]  LOCATION:  [facility]                         FACILITY:  [facility]  PHYSICIAN:  Anderson Malta, M.D.    DATE OF BIRTH:  1966/08/20  DATE OF PROCEDURE: DATE OF DISCHARGE:                              OPERATIVE REPORT   PREOPERATIVE DIAGNOSES:  Right shoulder rotator cuff tear, biceps tendon tearing, and bursitis.  POSTOPERATIVE DIAGNOSES:  Right shoulder rotator cuff tear, biceps tendon tearing, and bursitis.  PROCEDURES:  Right shoulder arthroscopy, biceps tendon release with labral debridement.  Debridement of synovitis within the rotator interval.  Mini-open rotator cuff tear repair of a 2 x 3-cm tear, which was chronic in nature with subacromial decompression and open biceps tenodesis.  SURGEON:  Anderson Malta, M.D.  ASSISTANT:  Laure Kidney, RNFA.  INDICATIONS:  Andres Howard is a 50 year old patient with right shoulder pain, presents for operative management after explanation of risks and benefits, and wished to proceed.  OPERATIVE FINDINGS: 1. Examination under anesthesia, range of motion, full forward     flexion, external rotation of 15 degrees with abduction was about     70.  The patient had good stability, anterior, posterior and     inferior. 2. Diagnostic arthroscopy.     a.     Tear of the biceps tendon, longitudinal split tear, which is      in accordance with the MRI scanning.     b.     Early synovitis within the rotator interval, but this did      not give him any restriction of passive range of motion.     c.     Rotator cuff tear, 3 x 2 cm involving supraspinatus with      some posterior retraction, upper subscap was frayed, but not torn.     d.     Tearing of the biceps tendon.  PROCEDURE IN DETAIL:  The patient was brought to the operating room where general anesthetic was induced.  Preoperative antibiotics were administered.  Time-out was called.  The patient was placed in  the beach- chair position with the head in neutral position.  Right shoulder was prescrubbed with alcohol and Betadine, allowed to air dry, prepped with DuraPrep solution and draped in a sterile manner.  Charlie Pitter was used to cover the axilla.  Time-out called, poster portal created to 2 cm inferomedial to the posterolateral margin of the acromion.  Diagnostic arthroscopy was performed.  The patient had a rotator cuff tear, split tearing of the biceps tendon as well as some labral synovitis.  Anterior portal created under direct visualization.  Synovitis debrided.  Biceps tendon released.  Labrum debrided.  Cuff debrided.  At this time, instruments were removed, portals were closed using 3-0 nylon.  Charlie Pitter was then used to cover the entire operative field.  Incision was made off the anterolateral margin of the acromion.  Skin and subcutaneous tissue were sharply divided, deltoid split, measured distance of 4 cm from the anterolateral margin of the acromion.  Stay suture was placed at the inferior aspect of the split.  Bursa was removed.  Rotator cuff tear was mobilized.  Edge was freshened.  The footprint was prepared with curette.  Biceps tendon was then tenodesed under appropriate tension with a 7 x 23-mm Arthrex Bio-tenodesis screw.  We used with a FiberLoop suture.  This gave good restoration of resting tension.  At this time, attention was directed towards the rotator cuff.  3-0 Monocryl sutures were placed in modified Mason-Allen sutures to help mobilize the edges.  Then, two 5.5 corkscrew suture anchors were placed at the space across the footprint of the supraspinatus.  This was done right on the articular surface margin, footprint margin.  The eight sutures were then passed in mattress fashion, six using the Scorpion, two using free needle, which were anterior.  Mattress sutures were then tied and then using two PushLocks, half of the mattress limbs were taken anteriorly, half were  taken posteriorly and then in a mattress fashion, both the Vicryls and the FiberLoop sutures were tied and this gave a very nice watertight repair.  Thorough irrigation performed. Subacromial decompression was adequate, which was then performed with the rasp.  Following this, thorough irrigation was performed.  Then, the deltoid split was closed using 0 Vicryl suture followed by interrupted inverted 2-0 Vicryl suture and 3-0 Monocryl.  Impervious dressings were applied along with a shoulder immobilizer.  The patient tolerated the procedure well without immediate complication, transferred to the recovery room in stable condition.     Anderson Malta, M.D.     GSD/MEDQ  D:  01/14/2016  T:  01/14/2016  Job:  [id number]

## 2016-01-14 NOTE — Anesthesia Postprocedure Evaluation (Signed)
Anesthesia Post Note  Patient: Andres Howard  Procedure(s) Performed: Procedure(s) (LRB): RIGHT SHOULDER ARTHROSCOPY WITH SUBACROMIAL DECOMPRESSION AND MINI OPEN ROTATOR CUFF REPAIR, BICEPS TENODESIS (Right)  Patient location during evaluation: PACU Anesthesia Type: General and Regional Level of consciousness: awake and alert Pain management: pain level controlled Vital Signs Assessment: post-procedure vital signs reviewed and stable Respiratory status: spontaneous breathing, nonlabored ventilation, respiratory function stable and patient connected to nasal cannula oxygen Cardiovascular status: blood pressure returned to baseline and stable Postop Assessment: no signs of nausea or vomiting Anesthetic complications: no    Last Vitals:  Filed Vitals:   01/14/16 0616 01/14/16 1105  BP: 118/84   Pulse: 66   Temp: 36.8 C 36.6 C    Last Pain:  Filed Vitals:   01/14/16 1111  PainSc: 0-No pain                 Zenaida Deed

## 2016-01-14 NOTE — Anesthesia Preprocedure Evaluation (Signed)
Anesthesia Evaluation  Patient identified by MRN, date of birth, ID band Patient awake    Reviewed: Allergy & Precautions, H&P , NPO status , Patient's Chart, lab work & pertinent test results  History of Anesthesia Complications Negative for: history of anesthetic complications  Airway Mallampati: II  TM Distance: >3 FB Neck ROM: full    Dental no notable dental hx.    Pulmonary Current Smoker,    Pulmonary exam normal breath sounds clear to auscultation       Cardiovascular negative cardio ROS Normal cardiovascular exam Rhythm:regular Rate:Normal     Neuro/Psych  Headaches,    GI/Hepatic negative GI ROS, Neg liver ROS,   Endo/Other  negative endocrine ROS  Renal/GU negative Renal ROS     Musculoskeletal   Abdominal   Peds  Hematology negative hematology ROS (+)   Anesthesia Other Findings   Reproductive/Obstetrics negative OB ROS                             Anesthesia Physical Anesthesia Plan  ASA: II  Anesthesia Plan: General and Regional   Post-op Pain Management: GA combined w/ Regional for post-op pain   Induction: Intravenous  Airway Management Planned: Oral ETT  Additional Equipment:   Intra-op Plan:   Post-operative Plan: Extubation in OR  Informed Consent: I have reviewed the patients History and Physical, chart, labs and discussed the procedure including the risks, benefits and alternatives for the proposed anesthesia with the patient or authorized representative who has indicated his/her understanding and acceptance.   Dental Advisory Given  Plan Discussed with: Anesthesiologist, CRNA and Surgeon  Anesthesia Plan Comments:         Anesthesia Quick Evaluation

## 2016-01-17 ENCOUNTER — Encounter (HOSPITAL_COMMUNITY): Payer: Self-pay | Admitting: Orthopedic Surgery

## 2016-01-31 ENCOUNTER — Encounter: Payer: Self-pay | Admitting: Physical Therapy

## 2016-01-31 ENCOUNTER — Ambulatory Visit: Payer: BLUE CROSS/BLUE SHIELD | Attending: Orthopedic Surgery | Admitting: Physical Therapy

## 2016-01-31 DIAGNOSIS — M25511 Pain in right shoulder: Secondary | ICD-10-CM

## 2016-01-31 DIAGNOSIS — M25411 Effusion, right shoulder: Secondary | ICD-10-CM | POA: Insufficient documentation

## 2016-01-31 DIAGNOSIS — M25611 Stiffness of right shoulder, not elsewhere classified: Secondary | ICD-10-CM | POA: Diagnosis present

## 2016-01-31 NOTE — Therapy (Signed)
[place], [place], [address] Phone: [phone]   Fax:  [phone]  Physical Therapy Evaluation  Patient Details  Name: Andres Howard MRN: [id number] Date of Birth: 1966-08-25 Referring Provider: Dr. Marlou Sa  Encounter Date: 01/31/2016      PT End of Session - 01/31/16 0916    Visit Number 1   Date for PT Re-Evaluation 04/01/16   PT Start Time 0844   PT Stop Time 0936   PT Time Calculation (min) 52 min   Activity Tolerance Patient limited by pain   Behavior During Therapy [facility] for tasks assessed/performed      Past Medical History  Diagnosis Date  . Headache     Past Surgical History  Procedure Laterality Date  . Rotator cuff repair    . Anterior cruciate ligament repair    . Leg surgery    . Hernia repair    . Shoulder arthroscopy with rotator cuff repair and subacromial decompression Right 01/14/2016    Procedure: RIGHT SHOULDER ARTHROSCOPY WITH SUBACROMIAL DECOMPRESSION AND MINI OPEN ROTATOR CUFF REPAIR, BICEPS TENODESIS;  Surgeon: Meredith Pel, MD;  Location: [facility];  Service: Orthopedics;  Laterality: Right;    There were no vitals filed for this visit.  Visit Diagnosis:  Right shoulder pain - Plan: PT plan of care cert/re-cert  Shoulder stiffness, right - Plan: PT plan of care cert/re-cert  Swelling of joint of shoulder region, right - Plan: PT plan of care cert/re-cert      Subjective Assessment - 01/31/16 0847    Subjective Patient reports that on January 23rd he fell out of the attic and hurt his right shoulder, he then had an MRI that showed a large RC tear.  He underwent SAD, biceps tenodesis, and RC repair on March 10th.   Limitations Lifting;House hold activities   Patient Stated Goals have normal motions and have less pain   Currently in Pain? Yes   Pain Score 4    Pain Location Shoulder   Pain Orientation Right   Pain Descriptors / Indicators Aching;Sore   Pain Type Surgical pain   Pain Onset 1 to 4 weeks ago   Pain Frequency Constant   Aggravating Factors  any motions pain up to 9-10/10   Pain Relieving Factors rest, sling can keep pain down to a 4/10   Effect of Pain on Daily Activities limits everything            [facility] PT Assessment - 01/31/16 0001    Assessment   Medical Diagnosis S/P right RC repair   Referring Provider Dr. Marlou Sa   Onset Date/Surgical Date 01/14/16   Hand Dominance Right   Precautions   Precaution Comments PROM and isometrics for the first 6 weeks   Balance Screen   Has the patient fallen in the past 6 months Yes   How many times? 1   Has the patient had a decrease in activity level because of a fear of falling?  No   Is the patient reluctant to leave their home because of a fear of falling?  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   Leisure Was doing some body weight exercises previously   Posture/Postural Control   Posture Comments gaurded  posture, arm held in a sling positions   PROM   Right Shoulder Flexion 70 Degrees   Right Shoulder ABduction 70 Degrees   Right Shoulder Internal Rotation 40 Degrees   Right Shoulder External Rotation 10 Degrees   Palpation   Palpation comment he is very tender to palpation over the right shoulder laterally and anteriorly, he has spasms in the right upper trap and neck                   [facility] Adult PT Treatment/Exercise - 01/31/16 0001    Modalities   Modalities Vasopneumatic;Electrical Stimulation   Electrical Stimulation   Electrical Stimulation Location right shoulder   Electrical Stimulation Action IFC   Electrical Stimulation Parameters sitting   Electrical Stimulation Goals Pain   Vasopneumatic   Number Minutes Vasopneumatic  15 minutes    Vasopnuematic Location  Shoulder   Vasopneumatic Pressure Medium   Vasopneumatic Temperature  35                PT Education - 01/31/16 0915    Education provided Yes   Education Details gave HEP for starting PROM at home for right shoulder S/P RC repair   Person(s) Educated Patient   Methods Explanation;Demonstration;Handout   Comprehension Verbalized understanding;Returned demonstration          PT Short Term Goals - 01/31/16 0918    PT SHORT TERM GOAL #1   Title independent with initial HEP   Time 2   Period Weeks   Status New           PT Long Term Goals - 01/31/16 0919    PT LONG TERM GOAL #1   Title decrease pain 50%   Time 12   Period Weeks   Status New   PT LONG TERM GOAL #2   Title increase strength to lift 5# over head   Time 12   Period Weeks   Status New   PT LONG TERM GOAL #3   Title dress without difficulty   Time 12   Period Weeks   Status New   PT LONG TERM GOAL #4   Title increase AROM of the right shoulder to 140 degrees flexion   Time 12   Period Weeks   Status New   PT LONG TERM GOAL #5   Title increase AROM of right shoulder ER/IR to 65 degrees   Time 12   Period Weeks   Status New               Plan - 01/31/16 0916    Clinical Impression Statement Patient had a fall in January that injured his right arm, he underwent a right RC repair, biceps tenodesis and SAD on 01/14/16.  He is in quite a bit of pain, has limited ROM and spasms in the right upper trap and into the neck   Pt will benefit from skilled therapeutic intervention in order to improve on the following deficits Decreased range of motion;Decreased strength;Increased edema;Increased muscle spasms;Postural dysfunction;Improper body mechanics;Pain   Rehab Potential Good   PT Frequency 2x / week   PT Duration 8 weeks   PT Treatment/Interventions ADLs/Self Care Home Management;Electrical Stimulation;Cryotherapy;Therapeutic activities;Therapeutic exercise;Manual  techniques;Vasopneumatic Device;Patient/family education   PT Next Visit Plan PROM and isometrics for the first 6 weeks   Consulted and Agree with Plan of Care Patient         Problem List There are no active problems to display for this patient.  Sumner Boast., PT 01/31/2016, 9:23 AM  [place], [place], [address] Phone: [phone]   Fax:  [phone]  Name: Andres Howard MRN: [id number] Date of Birth: [birth year]

## 2016-02-08 ENCOUNTER — Ambulatory Visit: Payer: BLUE CROSS/BLUE SHIELD | Attending: Orthopedic Surgery | Admitting: Physical Therapy

## 2016-02-08 ENCOUNTER — Encounter: Payer: Self-pay | Admitting: Physical Therapy

## 2016-02-08 DIAGNOSIS — M25611 Stiffness of right shoulder, not elsewhere classified: Secondary | ICD-10-CM | POA: Diagnosis present

## 2016-02-08 DIAGNOSIS — M25411 Effusion, right shoulder: Secondary | ICD-10-CM | POA: Diagnosis present

## 2016-02-08 DIAGNOSIS — M25511 Pain in right shoulder: Secondary | ICD-10-CM | POA: Insufficient documentation

## 2016-02-08 DIAGNOSIS — R2231 Localized swelling, mass and lump, right upper limb: Secondary | ICD-10-CM | POA: Diagnosis present

## 2016-02-08 NOTE — Therapy (Signed)
[place], [place], [address] Phone: [phone]   Fax:  [phone]  Physical Therapy Treatment  Patient Details  Name: Andres Howard MRN: [id number] Date of Birth: 1966/09/22 Referring Provider: Dr. Marlou Sa  Encounter Date: 02/08/2016      PT End of Session - 02/08/16 1055    Visit Number 2   PT Start Time [id number]   PT Stop Time 1108   PT Time Calculation (min) 53 min   Activity Tolerance Patient limited by pain   Behavior During Therapy [facility] for tasks assessed/performed      Past Medical History  Diagnosis Date  . Headache     Past Surgical History  Procedure Laterality Date  . Rotator cuff repair    . Anterior cruciate ligament repair    . Leg surgery    . Hernia repair    . Shoulder arthroscopy with rotator cuff repair and subacromial decompression Right 01/14/2016    Procedure: RIGHT SHOULDER ARTHROSCOPY WITH SUBACROMIAL DECOMPRESSION AND MINI OPEN ROTATOR CUFF REPAIR, BICEPS TENODESIS;  Surgeon: Meredith Pel, MD;  Location: [facility];  Service: Orthopedics;  Laterality: Right;    There were no vitals filed for this visit.  Visit Diagnosis:  Right shoulder pain  Shoulder stiffness, right  Swelling of joint of shoulder region, right      Subjective Assessment - 02/08/16 1018    Subjective "Not too bad, could be better could be worst."   Currently in Pain? No/denies   Pain Score 0-No pain                         [facility] Adult PT Treatment/Exercise - 02/08/16 0001    Exercises   Exercises Shoulder   Shoulder Exercises: Pulleys   Flexion 3 minutes   Other Pulley Exercises Scaption 3 minutes    Shoulder Exercises: Isometric Strengthening   Flexion --  10X3''   Extension --  3x10''   External Rotation --  10x3''   Internal Rotation --  10x3''   ABduction --  10x3''   Modalities   Modalities Vasopneumatic;Electrical Stimulation   Electrical  Stimulation   Electrical Stimulation Location right shoulder   Electrical Stimulation Parameters sitting    Electrical Stimulation Goals Pain   Vasopneumatic   Number Minutes Vasopneumatic  15 minutes   Vasopnuematic Location  Shoulder   Vasopneumatic Pressure Medium   Vasopneumatic Temperature  35   Manual Therapy   Manual Therapy Passive ROM;Manual Traction   Passive ROM R shoulder all directions, abd <90                  PT Short Term Goals - 01/31/16 [id number]    PT SHORT TERM GOAL #1   Title independent with initial HEP   Time 2   Period Weeks   Status New           PT Long Term Goals - 01/31/16 0919    PT LONG TERM GOAL #1   Title decrease pain 50%   Time 12   Period Weeks   Status New   PT LONG TERM GOAL #2   Title increase strength to lift 5# over head   Time 12   Period Weeks   Status New   PT LONG TERM GOAL #3   Title dress without difficulty   Time 12   Period Weeks   Status New   PT LONG TERM  GOAL #4   Title increase AROM of the right shoulder to 140 degrees flexion   Time 12   Period Weeks   Status New   PT LONG TERM GOAL #5   Title increase AROM of right shoulder ER/IR to 65 degrees   Time 12   Period Weeks   Status New               Plan - 02/08/16 1057    Clinical Impression Statement Pt progressed to Isometric exercises and tolerated them well. Some pain with MT at end range.   Pt will benefit from skilled therapeutic intervention in order to improve on the following deficits Decreased range of motion;Decreased strength;Increased edema;Increased muscle spasms;Postural dysfunction;Improper body mechanics;Pain   Rehab Potential Good   PT Frequency 2x / week   PT Duration 8 weeks   PT Treatment/Interventions ADLs/Self Care Home Management;Electrical Stimulation;Cryotherapy;Therapeutic activities;Therapeutic exercise;Manual techniques;Vasopneumatic Device;Patient/family education   PT Next Visit Plan PROM and isometrics for the  first 6 weeks        Problem List There are no active problems to display for this patient.   Scot Jun, PTA  02/08/2016, 11:02 AM  [place], [place], [address] Phone: [phone]   Fax:  [phone]  Name: Andres Howard MRN: [id number] Date of Birth: 10-17-1966

## 2016-02-10 ENCOUNTER — Encounter: Payer: Self-pay | Admitting: Physical Therapy

## 2016-02-10 ENCOUNTER — Ambulatory Visit: Payer: BLUE CROSS/BLUE SHIELD | Admitting: Physical Therapy

## 2016-02-10 DIAGNOSIS — M25511 Pain in right shoulder: Secondary | ICD-10-CM | POA: Diagnosis not present

## 2016-02-10 DIAGNOSIS — M25611 Stiffness of right shoulder, not elsewhere classified: Secondary | ICD-10-CM

## 2016-02-10 DIAGNOSIS — M25411 Effusion, right shoulder: Secondary | ICD-10-CM

## 2016-02-10 NOTE — Therapy (Signed)
[place], [place], [address] Phone: [phone]   Fax:  [phone]  Physical Therapy Treatment  Patient Details  Name: Andres Howard MRN: [id number] Date of Birth: 1966/04/19 Referring Provider: Dr. Marlou Sa  Encounter Date: 02/10/2016      PT End of Session - 02/10/16 1059    Visit Number 3   Date for PT Re-Evaluation 04/01/16   PT Start Time [id number]   PT Stop Time 1113   PT Time Calculation (min) 58 min   Activity Tolerance Patient tolerated treatment well   Behavior During Therapy [facility] for tasks assessed/performed      Past Medical History  Diagnosis Date  . Headache     Past Surgical History  Procedure Laterality Date  . Rotator cuff repair    . Anterior cruciate ligament repair    . Leg surgery    . Hernia repair    . Shoulder arthroscopy with rotator cuff repair and subacromial decompression Right 01/14/2016    Procedure: RIGHT SHOULDER ARTHROSCOPY WITH SUBACROMIAL DECOMPRESSION AND MINI OPEN ROTATOR CUFF REPAIR, BICEPS TENODESIS;  Surgeon: Meredith Pel, MD;  Location: [facility];  Service: Orthopedics;  Laterality: Right;    There were no vitals filed for this visit.  Visit Diagnosis:  Right shoulder pain  Shoulder stiffness, right  Swelling of joint of shoulder region, right      Subjective Assessment - 02/10/16 1018    Subjective "Doctor said continue on" "Things are fine no new issues"   Currently in Pain? Yes   Pain Score 6    Pain Location Shoulder   Pain Orientation Right                         [facility] Adult PT Treatment/Exercise - 02/10/16 0001    Exercises   Exercises Shoulder   Shoulder Exercises: Standing   Other Standing Exercises IR towel stretch x20   Shoulder Exercises: Pulleys   Flexion 3 minutes   ABduction 3 minutes   Other Pulley Exercises Scaption 3 minutes    Shoulder Exercises: Isometric Strengthening   Flexion --  3x10''   Extension --  3x10''   External Rotation --  10x3''   Internal Rotation --  10x3''   ABduction --  10x3''   Modalities   Modalities Vasopneumatic   Vasopneumatic   Number Minutes Vasopneumatic  15 minutes   Vasopnuematic Location  Shoulder   Vasopneumatic Pressure Medium   Vasopneumatic Temperature  35   Manual Therapy   Manual Therapy Passive ROM;Manual Traction   Passive ROM R shoulder all directions, abd <90                  PT Short Term Goals - 01/31/16 [id number]    PT SHORT TERM GOAL #1   Title independent with initial HEP   Time 2   Period Weeks   Status New           PT Long Term Goals - 01/31/16 0919    PT LONG TERM GOAL #1   Title decrease pain 50%   Time 12   Period Weeks   Status New   PT LONG TERM GOAL #2   Title increase strength to lift 5# over head   Time 12   Period Weeks   Status New   PT LONG TERM GOAL #3   Title dress without difficulty   Time 12  Period Weeks   Status New   PT LONG TERM GOAL #4   Title increase AROM of the right shoulder to 140 degrees flexion   Time 12   Period Weeks   Status New   PT LONG TERM GOAL #5   Title increase AROM of right shoulder ER/IR to 65 degrees   Time 12   Period Weeks   Status New               Plan - 02/10/16 1059    Clinical Impression Statement Pt tolerated treatment well. Continues to have pain at end range with MT    Pt will benefit from skilled therapeutic intervention in order to improve on the following deficits Decreased range of motion;Decreased strength;Increased edema;Increased muscle spasms;Postural dysfunction;Improper body mechanics;Pain   Rehab Potential Good   PT Frequency 2x / week   PT Duration 8 weeks   PT Treatment/Interventions ADLs/Self Care Home Management;Electrical Stimulation;Cryotherapy;Therapeutic activities;Therapeutic exercise;Manual techniques;Vasopneumatic Device;Patient/family education   PT Next Visit Plan PROM and isometrics for the first 6  weeks        Problem List There are no active problems to display for this patient.   Scot Jun, PTA  02/10/2016, 11:01 AM  [place] [address] [place], [place], [address] Phone: [phone]   Fax:  [phone]  Name: Andres Howard MRN: [id number] Date of Birth: [birth year]

## 2016-02-15 ENCOUNTER — Ambulatory Visit: Payer: BLUE CROSS/BLUE SHIELD | Admitting: Physical Therapy

## 2016-02-15 ENCOUNTER — Encounter: Payer: Self-pay | Admitting: Physical Therapy

## 2016-02-15 DIAGNOSIS — M25511 Pain in right shoulder: Secondary | ICD-10-CM | POA: Diagnosis not present

## 2016-02-15 DIAGNOSIS — M25611 Stiffness of right shoulder, not elsewhere classified: Secondary | ICD-10-CM

## 2016-02-15 NOTE — Therapy (Signed)
[place], [place], [address] Phone: [phone]   Fax:  [phone]  Physical Therapy Treatment  Patient Details  Name: Andres Howard MRN: [id number] Date of Birth: 22-Oct-1966 Referring Provider: Dr. Marlou Sa  Encounter Date: 02/15/2016      PT End of Session - 02/15/16 1016    Visit Number 4   Date for PT Re-Evaluation 04/01/16   PT Start Time [id number]   PT Stop Time 1017   PT Time Calculation (min) 39 min   Activity Tolerance Patient tolerated treatment well   Behavior During Therapy [facility] for tasks assessed/performed      Past Medical History  Diagnosis Date  . Headache     Past Surgical History  Procedure Laterality Date  . Rotator cuff repair    . Anterior cruciate ligament repair    . Leg surgery    . Hernia repair    . Shoulder arthroscopy with rotator cuff repair and subacromial decompression Right 01/14/2016    Procedure: RIGHT SHOULDER ARTHROSCOPY WITH SUBACROMIAL DECOMPRESSION AND MINI OPEN ROTATOR CUFF REPAIR, BICEPS TENODESIS;  Surgeon: Meredith Pel, MD;  Location: [facility];  Service: Orthopedics;  Laterality: Right;    There were no vitals filed for this visit.      Subjective Assessment - 02/15/16 0941    Subjective "Same old tired and sore, couldn't sleep much last night. I done something yesterday reaching with my arm"   Currently in Pain? Yes   Pain Score 6    Pain Location Shoulder   Pain Orientation Right                         [facility] Adult PT Treatment/Exercise - 02/15/16 0001    Exercises   Exercises Shoulder   Shoulder Exercises: Standing   Other Standing Exercises IR towell stretch x20   Shoulder Exercises: Pulleys   Flexion 3 minutes   ABduction 3 minutes   Other Pulley Exercises Scaption 3 minutes    Shoulder Exercises: Isometric Strengthening   Flexion --  10x3''   Extension --  10x3''   External Rotation --  10x3''   Internal  Rotation --  10x3''   ABduction --  10x3''   Manual Therapy   Manual Therapy Passive ROM;Manual Traction   Passive ROM R shoulder all directions, abd <90                  PT Short Term Goals - 01/31/16 [id number]    PT SHORT TERM GOAL #1   Title independent with initial HEP   Time 2   Period Weeks   Status New           PT Long Term Goals - 01/31/16 0919    PT LONG TERM GOAL #1   Title decrease pain 50%   Time 12   Period Weeks   Status New   PT LONG TERM GOAL #2   Title increase strength to lift 5# over head   Time 12   Period Weeks   Status New   PT LONG TERM GOAL #3   Title dress without difficulty   Time 12   Period Weeks   Status New   PT LONG TERM GOAL #4   Title increase AROM of the right shoulder to 140 degrees flexion   Time 12   Period Weeks   Status New   PT LONG TERM GOAL #5  Title increase AROM of right shoulder ER/IR to 65 degrees   Time 12   Period Weeks   Status New               Plan - 02/15/16 1020    Clinical Impression Statement Pt 8 minutes late for today's treatment. Pt completed all interventions but reports increase fatigue. More tightness noted during today's treatment compared to last. Pt denied Ice post treatment  due to having something to do.   PT Frequency 2x / week   PT Duration 8 weeks   PT Treatment/Interventions ADLs/Self Care Home Management;Electrical Stimulation;Cryotherapy;Therapeutic activities;Therapeutic exercise;Manual techniques;Vasopneumatic Device;Patient/family education      Patient will benefit from skilled therapeutic intervention in order to improve the following deficits and impairments:  Decreased range of motion, Decreased strength, Increased edema, Increased muscle spasms, Postural dysfunction, Improper body mechanics, Pain  Visit Diagnosis: Pain in right shoulder  Stiffness of right shoulder, not elsewhere classified     Problem List There are no active problems to display for this  patient.   Scot Jun, PTA  02/15/2016, 10:24 AM  [place], [place], [address] Phone: [phone]   Fax:  [phone]  Name: Andres Howard MRN: [id number] Date of Birth: [birth year]

## 2016-02-16 NOTE — Addendum Note (Signed)
Addended by: Sumner Boast on: 02/16/2016 11:45 AM   Modules accepted: Orders

## 2016-02-17 ENCOUNTER — Encounter: Payer: Self-pay | Admitting: Physical Therapy

## 2016-02-17 ENCOUNTER — Ambulatory Visit: Payer: BLUE CROSS/BLUE SHIELD | Admitting: Physical Therapy

## 2016-02-17 DIAGNOSIS — R2231 Localized swelling, mass and lump, right upper limb: Secondary | ICD-10-CM

## 2016-02-17 DIAGNOSIS — M25611 Stiffness of right shoulder, not elsewhere classified: Secondary | ICD-10-CM

## 2016-02-17 DIAGNOSIS — M25511 Pain in right shoulder: Secondary | ICD-10-CM | POA: Diagnosis not present

## 2016-02-17 NOTE — Therapy (Signed)
[place], [place], [address] Phone: [phone]   Fax:  [phone]  Physical Therapy Treatment  Patient Details  Name: Andres Howard MRN: [id number] Date of Birth: [birth year] Referring Provider: Dr. Marlou Sa  Encounter Date: 02/17/2016      PT End of Session - 02/17/16 0927    Visit Number 5   Date for PT Re-Evaluation 04/01/16   PT Start Time 0845   PT Stop Time 0935   PT Time Calculation (min) 50 min   Activity Tolerance Patient tolerated treatment well   Behavior During Therapy [facility] for tasks assessed/performed      Past Medical History  Diagnosis Date  . Headache     Past Surgical History  Procedure Laterality Date  . Rotator cuff repair    . Anterior cruciate ligament repair    . Leg surgery    . Hernia repair    . Shoulder arthroscopy with rotator cuff repair and subacromial decompression Right 01/14/2016    Procedure: RIGHT SHOULDER ARTHROSCOPY WITH SUBACROMIAL DECOMPRESSION AND MINI OPEN ROTATOR CUFF REPAIR, BICEPS TENODESIS;  Surgeon: Meredith Pel, MD;  Location: [facility];  Service: Orthopedics;  Laterality: Right;    There were no vitals filed for this visit.      Subjective Assessment - 02/17/16 0850    Subjective "Im doing good"   Currently in Pain? No/denies   Pain Score 0-No pain                         [facility] Adult PT Treatment/Exercise - 02/17/16 0001    Exercises   Exercises Shoulder   Shoulder Exercises: Standing   Other Standing Exercises Ball up wall x20; BAll on wall CW/CCW x10   Other Standing Exercises IR pulley x20   Shoulder Exercises: Pulleys   Flexion 3 minutes   ABduction 3 minutes   Other Pulley Exercises Scaption 3 minutes    Modalities   Modalities Cryotherapy   Cryotherapy   Number Minutes Cryotherapy 10 Minutes   Cryotherapy Location Shoulder   Type of Cryotherapy Ice pack   Manual Therapy   Manual Therapy Passive  ROM;Manual Traction   Passive ROM R shoulder all directions, abd <90                  PT Short Term Goals - 01/31/16 [id number]    PT SHORT TERM GOAL #1   Title independent with initial HEP   Time 2   Period Weeks   Status New           PT Long Term Goals - 01/31/16 0919    PT LONG TERM GOAL #1   Title decrease pain 50%   Time 12   Period Weeks   Status New   PT LONG TERM GOAL #2   Title increase strength to lift 5# over head   Time 12   Period Weeks   Status New   PT LONG TERM GOAL #3   Title dress without difficulty   Time 12   Period Weeks   Status New   PT LONG TERM GOAL #4   Title increase AROM of the right shoulder to 140 degrees flexion   Time 12   Period Weeks   Status New   PT LONG TERM GOAL #5   Title increase AROM of right shoulder ER/IR to 65 degrees   Time 12   Period Weeks  Status New               Plan - 02/17/16 [id number]    Clinical Impression Statement Pain at end range with MT. Tolerated all new interventions well.    Rehab Potential Good   PT Frequency 2x / week   PT Duration 8 weeks   PT Treatment/Interventions ADLs/Self Care Home Management;Electrical Stimulation;Cryotherapy;Therapeutic activities;Therapeutic exercise;Manual techniques;Vasopneumatic Device;Patient/family education   PT Next Visit Plan PROM and isometrics for the first 6 weeks      Patient will benefit from skilled therapeutic intervention in order to improve the following deficits and impairments:  Decreased range of motion, Decreased strength, Increased edema, Increased muscle spasms, Postural dysfunction, Improper body mechanics, Pain  Visit Diagnosis: Localized swelling, mass and lump, right upper limb  Right shoulder pain  Stiffness of right shoulder, not elsewhere classified     Problem List There are no active problems to display for this patient.   Scot Jun, PTA  02/17/2016, 9:29 AM  [place] [id number] [place], [place], [address] Phone: [phone]   Fax:  [phone]  Name: ARVEL ALESSIO MRN: [id number] Date of Birth: [birth year]

## 2016-02-22 ENCOUNTER — Ambulatory Visit: Payer: BLUE CROSS/BLUE SHIELD | Admitting: Physical Therapy

## 2016-02-22 ENCOUNTER — Encounter: Payer: Self-pay | Admitting: Physical Therapy

## 2016-02-22 DIAGNOSIS — M25511 Pain in right shoulder: Secondary | ICD-10-CM

## 2016-02-22 DIAGNOSIS — R2231 Localized swelling, mass and lump, right upper limb: Secondary | ICD-10-CM

## 2016-02-22 DIAGNOSIS — M25611 Stiffness of right shoulder, not elsewhere classified: Secondary | ICD-10-CM

## 2016-02-22 NOTE — Therapy (Signed)
[place], [place], [address] Phone: [phone]   Fax:  [phone]  Physical Therapy Treatment  Patient Details  Name: Andres Howard MRN: [id number] Date of Birth: Jun 23, 1966 Referring Provider: Dr. Marlou Sa  Encounter Date: 02/22/2016      PT End of Session - 02/22/16 1028    Visit Number 6   Date for PT Re-Evaluation 04/01/16   PT Start Time 0933   PT Stop Time 1027   PT Time Calculation (min) 54 min   Activity Tolerance Patient tolerated treatment well   Behavior During Therapy [facility] for tasks assessed/performed      Past Medical History  Diagnosis Date  . Headache     Past Surgical History  Procedure Laterality Date  . Rotator cuff repair    . Anterior cruciate ligament repair    . Leg surgery    . Hernia repair    . Shoulder arthroscopy with rotator cuff repair and subacromial decompression Right 01/14/2016    Procedure: RIGHT SHOULDER ARTHROSCOPY WITH SUBACROMIAL DECOMPRESSION AND MINI OPEN ROTATOR CUFF REPAIR, BICEPS TENODESIS;  Surgeon: Meredith Pel, MD;  Location: [facility];  Service: Orthopedics;  Laterality: Right;    There were no vitals filed for this visit.      Subjective Assessment - 02/22/16 0939    Subjective "I slept on it wrong last night, so it's sore today from that. My bicep is what's bothering me the most."   Currently in Pain? Yes   Pain Score 5    Pain Location Shoulder   Pain Orientation Right   Pain Descriptors / Indicators Aching;Sore                         [facility] Adult PT Treatment/Exercise - 02/22/16 0001    Exercises   Exercises Shoulder   Shoulder Exercises: Standing   Other Standing Exercises Ball up wall x20; BAll on wall up/down/CW/CCW x10   Other Standing Exercises IR pulley x20   Shoulder Exercises: Pulleys   Flexion 3 minutes   ABduction 3 minutes   Other Pulley Exercises Scaption 3 minutes    Shoulder Exercises:  ROM/Strengthening   Pendulum x20 4 ways   Shoulder Exercises: Isometric Strengthening   Flexion --  10x3"   Extension --  10x3"   External Rotation --  10x3"   Internal Rotation --  10x3"   ABduction --  10x3"   Modalities   Modalities Cryotherapy   Cryotherapy   Number Minutes Cryotherapy 10 Minutes   Cryotherapy Location Shoulder   Type of Cryotherapy Ice pack   Manual Therapy   Manual Therapy Passive ROM;Manual Traction   Passive ROM R shoulder all directions, abd <90                  PT Short Term Goals - 01/31/16 0918    PT SHORT TERM GOAL #1   Title independent with initial HEP   Time 2   Period Weeks   Status New           PT Long Term Goals - 01/31/16 0919    PT LONG TERM GOAL #1   Title decrease pain 50%   Time 12   Period Weeks   Status New   PT LONG TERM GOAL #2   Title increase strength to lift 5# over head   Time 12   Period Weeks   Status New  PT LONG TERM GOAL #3   Title dress without difficulty   Time 12   Period Weeks   Status New   PT LONG TERM GOAL #4   Title increase AROM of the right shoulder to 140 degrees flexion   Time 12   Period Weeks   Status New   PT LONG TERM GOAL #5   Title increase AROM of right shoulder ER/IR to 65 degrees   Time 12   Period Weeks   Status New               Plan - 02/22/16 1029    Clinical Impression Statement Pt more sore and tight from sleeping on his right shoulder wrong last night. He tolerated ther ex well, with pain at end range. Pain with MT, especially in ER.   PT Treatment/Interventions ADLs/Self Care Home Management;Electrical Stimulation;Cryotherapy;Therapeutic activities;Therapeutic exercise;Manual techniques;Vasopneumatic Device;Patient/family education   PT Next Visit Plan Continue PROM and isometrics for first 6 weeks.       Patient will benefit from skilled therapeutic intervention in order to improve the following deficits and impairments:     Visit  Diagnosis: Localized swelling, mass and lump, right upper limb  Right shoulder pain  Stiffness of right shoulder, not elsewhere classified     Problem List There are no active problems to display for this patient.   Oslo Huntsman SPTA 02/22/2016, 10:36 AM  [place] [address] [place], [place], [address] Phone: [phone]   Fax:  [phone]  Name: YOHANAN FERRONI MRN: [id number] Date of Birth: [birth year]

## 2016-02-24 ENCOUNTER — Ambulatory Visit: Payer: BLUE CROSS/BLUE SHIELD | Admitting: Physical Therapy

## 2016-02-24 ENCOUNTER — Encounter: Payer: Self-pay | Admitting: Physical Therapy

## 2016-02-24 DIAGNOSIS — M25511 Pain in right shoulder: Secondary | ICD-10-CM | POA: Diagnosis not present

## 2016-02-24 DIAGNOSIS — R2231 Localized swelling, mass and lump, right upper limb: Secondary | ICD-10-CM

## 2016-02-24 DIAGNOSIS — M25611 Stiffness of right shoulder, not elsewhere classified: Secondary | ICD-10-CM

## 2016-02-24 NOTE — Therapy (Signed)
[place], [place], [address] Phone: [phone]   Fax:  [phone]  Physical Therapy Treatment  Patient Details  Name: Andres Howard MRN: [id number] Date of Birth: 24-Aug-1966 Referring Provider: Dr. Marlou Sa  Encounter Date: 02/24/2016      PT End of Session - 02/24/16 0932    Visit Number 7   Date for PT Re-Evaluation 04/01/16   PT Start Time 0847   PT Stop Time 0945   PT Time Calculation (min) 58 min   Activity Tolerance Patient tolerated treatment well   Behavior During Therapy [facility] for tasks assessed/performed      Past Medical History  Diagnosis Date  . Headache     Past Surgical History  Procedure Laterality Date  . Rotator cuff repair    . Anterior cruciate ligament repair    . Leg surgery    . Hernia repair    . Shoulder arthroscopy with rotator cuff repair and subacromial decompression Right 01/14/2016    Procedure: RIGHT SHOULDER ARTHROSCOPY WITH SUBACROMIAL DECOMPRESSION AND MINI OPEN ROTATOR CUFF REPAIR, BICEPS TENODESIS;  Surgeon: Meredith Pel, MD;  Location: [place];  Service: Orthopedics;  Laterality: Right;    There were no vitals filed for this visit.      Subjective Assessment - 02/24/16 0849    Subjective Pt amb in clinic without sling, pt reports that he has went back to work a little. Sees MD on 27 th.    Currently in Pain? Yes   Pain Score 5    Pain Location Shoulder   Pain Orientation Right                         [facility] Adult PT Treatment/Exercise - 02/24/16 0001    Shoulder Exercises: Standing   Other Standing Exercises Ball up wall x20; BAll on wall up/down/CW/CCW x10   Other Standing Exercises IR pulley x20   Shoulder Exercises: Pulleys   Flexion 3 minutes   ABduction 3 minutes   Other Pulley Exercises Scaption 3 minutes    Shoulder Exercises: Isometric Strengthening   Flexion Supine  10x3''   Extension Supine  10x3''   External Rotation Supine  10x3''   Internal Rotation Supine  10x3''   ABduction Supine  5x3''   ADduction Supine  5x3''   Modalities   Modalities Vasopneumatic   Vasopneumatic   Number Minutes Vasopneumatic  15 minutes   Vasopnuematic Location  Shoulder   Vasopneumatic Pressure Medium   Vasopneumatic Temperature  34   Manual Therapy   Manual Therapy Passive ROM;Manual Traction   Passive ROM R shoulder all directions, abd <90                  PT Short Term Goals - 01/31/16 [id number]    PT SHORT TERM GOAL #1   Title independent with initial HEP   Time 2   Period Weeks   Status New           PT Long Term Goals - 01/31/16 0919    PT LONG TERM GOAL #1   Title decrease pain 50%   Time 12   Period Weeks   Status New   PT LONG TERM GOAL #2   Title increase strength to lift 5# over head   Time 12   Period Weeks   Status New   PT LONG TERM GOAL #3   Title dress without difficulty  Time 12   Period Weeks   Status New   PT LONG TERM GOAL #4   Title increase AROM of the right shoulder to 140 degrees flexion   Time 12   Period Weeks   Status New   PT LONG TERM GOAL #5   Title increase AROM of right shoulder ER/IR to 65 degrees   Time 12   Period Weeks   Status New               Plan - 02/24/16 0932    Clinical Impression Statement Pt informed not to do too much to soon with RUE. Completed all interventions well, More ROM noted with pulley interventions. Isometric performed supine against therapist resistance, good strength noted, fatigues quickly with IR and abduction.   Rehab Potential Good   PT Frequency 2x / week   PT Duration 8 weeks   PT Treatment/Interventions ADLs/Self Care Home Management;Electrical Stimulation;Cryotherapy;Therapeutic activities;Therapeutic exercise;Manual techniques;Vasopneumatic Device;Patient/family education   PT Next Visit Plan Continue PROM and isometrics for first 6 weeks.       Patient will benefit from skilled  therapeutic intervention in order to improve the following deficits and impairments:  Decreased range of motion, Decreased strength, Increased edema, Increased muscle spasms, Postural dysfunction, Improper body mechanics, Pain  Visit Diagnosis: Localized swelling, mass and lump, right upper limb  Stiffness of right shoulder, not elsewhere classified  Pain in right shoulder     Problem List There are no active problems to display for this patient.   Scot Jun, PTA  02/24/2016, 9:35 AM  [place] [address] [place], [place], [address] Phone: [phone]   Fax:  [phone]  Name: Andres Howard MRN: [id number] Date of Birth: 01-07-1966

## 2016-02-29 ENCOUNTER — Ambulatory Visit: Payer: BLUE CROSS/BLUE SHIELD | Admitting: Physical Therapy

## 2016-02-29 ENCOUNTER — Encounter: Payer: Self-pay | Admitting: Physical Therapy

## 2016-02-29 DIAGNOSIS — M25511 Pain in right shoulder: Secondary | ICD-10-CM

## 2016-02-29 DIAGNOSIS — M25611 Stiffness of right shoulder, not elsewhere classified: Secondary | ICD-10-CM

## 2016-02-29 DIAGNOSIS — R2231 Localized swelling, mass and lump, right upper limb: Secondary | ICD-10-CM

## 2016-02-29 NOTE — Therapy (Signed)
[place], [place], [address] Phone: [phone]   Fax:  [phone]  Physical Therapy Treatment  Patient Details  Name: Andres Howard MRN: [id number] Date of Birth: [birth year] Referring Provider: Dr. Marlou Sa  Encounter Date: 02/29/2016      PT End of Session - 02/29/16 0925    Visit Number 8   Date for PT Re-Evaluation 04/01/16   PT Start Time 0845   PT Stop Time 0940   PT Time Calculation (min) 55 min   Activity Tolerance Patient tolerated treatment well   Behavior During Therapy [facility] for tasks assessed/performed      Past Medical History  Diagnosis Date  . Headache     Past Surgical History  Procedure Laterality Date  . Rotator cuff repair    . Anterior cruciate ligament repair    . Leg surgery    . Hernia repair    . Shoulder arthroscopy with rotator cuff repair and subacromial decompression Right 01/14/2016    Procedure: RIGHT SHOULDER ARTHROSCOPY WITH SUBACROMIAL DECOMPRESSION AND MINI OPEN ROTATOR CUFF REPAIR, BICEPS TENODESIS;  Surgeon: Meredith Pel, MD;  Location: [place];  Service: Orthopedics;  Laterality: Right;    There were no vitals filed for this visit.      Subjective Assessment - 02/29/16 0850    Subjective Again pt ambulated in clinic without sling. Pt reports that things are going good   Currently in Pain? Yes   Pain Score 5    Pain Location Shoulder   Pain Orientation Right                         [facility] Adult PT Treatment/Exercise - 02/29/16 0001    Shoulder Exercises: Standing   Other Standing Exercises Ball on table with hip hinge x20    Other Standing Exercises IR pulley x20   Shoulder Exercises: Pulleys   Flexion 3 minutes   ABduction 3 minutes   Other Pulley Exercises Scaption 3 minutes    Shoulder Exercises: Isometric Strengthening   Flexion Supine;5X10"   Extension Supine;5X10"   External Rotation Supine;5X5"   Internal  Rotation Supine;5X5"   ABduction Supine;5X5"   ADduction Supine;5X5"   Modalities   Modalities Vasopneumatic   Vasopneumatic   Number Minutes Vasopneumatic  15 minutes   Vasopnuematic Location  Shoulder   Vasopneumatic Pressure Medium   Vasopneumatic Temperature  34   Manual Therapy   Manual Therapy Passive ROM;Manual Traction   Passive ROM R shoulder all directions, abd <90                  PT Short Term Goals - 02/29/16 0927    PT SHORT TERM GOAL #1   Title independent with initial HEP   Status Achieved           PT Long Term Goals - 02/29/16 0927    PT LONG TERM GOAL #3   Title dress without difficulty   Status Partially Met               Plan - 02/29/16 0925    Clinical Impression Statement Pt reports that he has a MD appointment tomorrow. Demos good isometric strength against manual resistance. Cues provided to prevent valsalva. Good PROM with MT   Rehab Potential Good   PT Frequency 2x / week   PT Duration 8 weeks   PT Treatment/Interventions ADLs/Self Care Home Management;Electrical Stimulation;Cryotherapy;Therapeutic activities;Therapeutic exercise;Manual  techniques;Vasopneumatic Device;Patient/family education   PT Next Visit Plan Get instructions from MD      Patient will benefit from skilled therapeutic intervention in order to improve the following deficits and impairments:  Decreased range of motion, Decreased strength, Increased edema, Increased muscle spasms, Postural dysfunction, Improper body mechanics, Pain  Visit Diagnosis: Localized swelling, mass and lump, right upper limb  Stiffness of right shoulder, not elsewhere classified  Pain in right shoulder     Problem List There are no active problems to display for this patient.   Scot Jun, PTA  02/29/2016, 9:29 AM  [address] [address] [place], [place], [address] Phone: [phone]   Fax:   [phone]  Name: Andres Howard MRN: [id number] Date of Birth: 05-05-1966

## 2016-03-02 ENCOUNTER — Ambulatory Visit: Payer: BLUE CROSS/BLUE SHIELD | Admitting: Physical Therapy

## 2016-03-02 ENCOUNTER — Encounter: Payer: Self-pay | Admitting: Physical Therapy

## 2016-03-02 DIAGNOSIS — R2231 Localized swelling, mass and lump, right upper limb: Secondary | ICD-10-CM

## 2016-03-02 DIAGNOSIS — M25511 Pain in right shoulder: Secondary | ICD-10-CM

## 2016-03-02 DIAGNOSIS — M25611 Stiffness of right shoulder, not elsewhere classified: Secondary | ICD-10-CM

## 2016-03-02 NOTE — Therapy (Signed)
[place], [place], [address] Phone: [phone]   Fax:  [phone]  Physical Therapy Treatment  Patient Details  Name: Andres Howard MRN: [id number] Date of Birth: 07-24-1966 Referring Provider: Dr. Marlou Sa  Encounter Date: 03/02/2016      PT End of Session - 03/02/16 1414    Visit Number 9   Date for PT Re-Evaluation 04/01/16   PT Start Time 1327   PT Stop Time 1430   PT Time Calculation (min) 63 min   Activity Tolerance Patient tolerated treatment well   Behavior During Therapy [facility] for tasks assessed/performed      Past Medical History  Diagnosis Date  . Headache     Past Surgical History  Procedure Laterality Date  . Rotator cuff repair    . Anterior cruciate ligament repair    . Leg surgery    . Hernia repair    . Shoulder arthroscopy with rotator cuff repair and subacromial decompression Right 01/14/2016    Procedure: RIGHT SHOULDER ARTHROSCOPY WITH SUBACROMIAL DECOMPRESSION AND MINI OPEN ROTATOR CUFF REPAIR, BICEPS TENODESIS;  Surgeon: Andres Pel, MD;  Location: [place];  Service: Orthopedics;  Laterality: Right;    There were no vitals filed for this visit.          [facility] PT Assessment - 03/02/16 0001    ROM / Strength   AROM / PROM / Strength AROM   AROM   Overall AROM  Deficits   AROM Assessment Site Shoulder   Right/Left Shoulder Right   Right Shoulder Flexion 144 Degrees   Right Shoulder ABduction 105 Degrees   Right Shoulder Internal Rotation 49 Degrees   Right Shoulder External Rotation 40 Degrees   PROM   Right Shoulder Flexion --   Right Shoulder ABduction --   Right Shoulder Internal Rotation --                     [facility] Adult PT Treatment/Exercise - 03/02/16 0001    Shoulder Exercises: Standing   External Rotation 15 reps;Theraband  2 sets    Theraband Level (Shoulder External Rotation) Level 1 (Yellow)   Internal Rotation 15  reps;Theraband  2 sets    Theraband Level (Shoulder Internal Rotation) Level 1 (Yellow)   Flexion 10 reps;AROM  2 sets, stopping when R shoulder begins to elevate    Extension 15 reps;Theraband  2 sets    Theraband Level (Shoulder Extension) Level 2 (Red)   Row Theraband;15 reps  2 sets   Theraband Level (Shoulder Row) Level 2 (Red)   Other Standing Exercises Ladder abd x5   therapist assist with descents   Other Standing Exercises Bicep curl #2 2x15    Shoulder Exercises: ROM/Strengthening   UBE (Upper Arm Bike) L 3 frd/3rev   Vasopneumatic   Number Minutes Vasopneumatic  15 minutes   Vasopnuematic Location  Shoulder   Vasopneumatic Pressure Medium   Vasopneumatic Temperature  34   Manual Therapy   Manual Therapy Passive ROM;Manual Traction   Passive ROM R shoulder all directions, abd <90                  PT Short Term Goals - 02/29/16 0927    PT SHORT TERM GOAL #1   Title independent with initial HEP   Status Achieved           PT Long Term Goals - 02/29/16 0927    PT LONG  TERM GOAL #3   Title dress without difficulty   Status Partially Met               Plan - 03/02/16 1415    Clinical Impression Statement Pt with new order from MD to start some strengthening. Tolerated light Tband resisted interventions well, and shows good AROM. Some R shoulder elevation with active flexion.      Rehab Potential Good   PT Frequency 2x / week   PT Duration 8 weeks   PT Next Visit Plan Light strengthening      Patient will benefit from skilled therapeutic intervention in order to improve the following deficits and impairments:  Decreased range of motion, Decreased strength, Increased edema, Increased muscle spasms, Postural dysfunction, Improper body mechanics, Pain  Visit Diagnosis: Localized swelling, mass and lump, right upper limb  Stiffness of right shoulder, not elsewhere classified  Pain in right shoulder     Problem List There are no active  problems to display for this patient.   Scot Jun, PTA  03/02/2016, 2:22 PM  [place], [place], [address] Phone: [phone]   Fax:  [phone]  Name: Andres Howard MRN: [id number] Date of Birth: June 18, 1966

## 2016-03-07 ENCOUNTER — Ambulatory Visit: Payer: BLUE CROSS/BLUE SHIELD | Attending: Orthopedic Surgery | Admitting: Physical Therapy

## 2016-03-07 ENCOUNTER — Encounter: Payer: Self-pay | Admitting: Physical Therapy

## 2016-03-07 DIAGNOSIS — R2231 Localized swelling, mass and lump, right upper limb: Secondary | ICD-10-CM | POA: Diagnosis present

## 2016-03-07 DIAGNOSIS — M25411 Effusion, right shoulder: Secondary | ICD-10-CM | POA: Insufficient documentation

## 2016-03-07 DIAGNOSIS — M25611 Stiffness of right shoulder, not elsewhere classified: Secondary | ICD-10-CM | POA: Diagnosis not present

## 2016-03-07 DIAGNOSIS — M25511 Pain in right shoulder: Secondary | ICD-10-CM | POA: Diagnosis present

## 2016-03-07 NOTE — Therapy (Signed)
[place], [place], [address] Phone: [phone]   Fax:  [phone]  Physical Therapy Treatment  Patient Details  Name: Andres Howard MRN: [id number] Date of Birth: [birth year] Referring Provider: Dr. Marlou Sa  Encounter Date: 03/07/2016      PT End of Session - 03/07/16 1014    Visit Number 10   Date for PT Re-Evaluation 04/01/16   PT Start Time 0931   PT Stop Time 1027   PT Time Calculation (min) 56 min   Activity Tolerance Patient tolerated treatment well   Behavior During Therapy [facility] for tasks assessed/performed      Past Medical History  Diagnosis Date  . Headache     Past Surgical History  Procedure Laterality Date  . Rotator cuff repair    . Anterior cruciate ligament repair    . Leg surgery    . Hernia repair    . Shoulder arthroscopy with rotator cuff repair and subacromial decompression Right 01/14/2016    Procedure: RIGHT SHOULDER ARTHROSCOPY WITH SUBACROMIAL DECOMPRESSION AND MINI OPEN ROTATOR CUFF REPAIR, BICEPS TENODESIS;  Surgeon: Meredith Pel, MD;  Location: [facility];  Service: Orthopedics;  Laterality: Right;    There were no vitals filed for this visit.      Subjective Assessment - 03/07/16 0932    Subjective "Pretty good" Pt reports that he he did have one incident were he tried to close a door behind him reaching back with RLE and had pain, otherwise no issue    Currently in Pain? No/denies   Pain Score 0-No pain                         [facility] Adult PT Treatment/Exercise - 03/07/16 0001    Shoulder Exercises: Standing   External Rotation 15 reps;Theraband  x2   Theraband Level (Shoulder External Rotation) Level 1 (Yellow);Level 2 (Red)   Internal Rotation 15 reps;Theraband  x2   Theraband Level (Shoulder Internal Rotation) Level 2 (Red)   Extension 15 reps;Theraband  x2   Theraband Level (Shoulder Extension) Level 2 (Red)   Other Standing  Exercises Ladder Flex & abd x10; Flexion up wall with pillow case X15     Other Standing Exercises Bicep curl #3 2x15    Shoulder Exercises: ROM/Strengthening   UBE (Upper Arm Bike) L 1 frd/3rev   Shoulder Exercises: Power [employer] 10 reps  x2   Row Limitations 20lb   Other Power Tower Exercises Lats #20 2x10    Modalities   Modalities Vasopneumatic   Vasopneumatic   Number Minutes Vasopneumatic  15 minutes   Vasopnuematic Location  Shoulder   Vasopneumatic Pressure High   Vasopneumatic Temperature  34   Manual Therapy   Manual Therapy Passive ROM;Manual Traction;Joint mobilization   Manual therapy comments PROM taken to end  range and held   Joint Mobilization grages 2-3    Passive ROM R shoulder all directions, abd <90                  PT Short Term Goals - 02/29/16 0927    PT SHORT TERM GOAL #1   Title independent with initial HEP   Status Achieved           PT Long Term Goals - 02/29/16 4239    PT LONG TERM GOAL #3   Title dress without difficulty   Status Partially Met  Plan - 03/07/16 1014    Clinical Impression Statement Again pt performed well in PT. Shows good strength for his stage in recovery and demos good ROM with MT. R shoulder IR remains limited.   Rehab Potential Good   PT Frequency 2x / week   PT Duration 8 weeks   PT Treatment/Interventions ADLs/Self Care Home Management;Electrical Stimulation;Cryotherapy;Therapeutic activities;Therapeutic exercise;Manual techniques;Vasopneumatic Device;Patient/family education   PT Next Visit Plan Light strengthening and ROM      Patient will benefit from skilled therapeutic intervention in order to improve the following deficits and impairments:  Decreased range of motion, Decreased strength, Increased edema, Increased muscle spasms, Postural dysfunction, Improper body mechanics, Pain  Visit Diagnosis: Stiffness of right shoulder, not elsewhere classified  Localized swelling,  mass and lump, right upper limb  Pain in right shoulder     Problem List There are no active problems to display for this patient.   Scot Jun, PTA  03/07/2016, 10:16 AM  [place], [place], [address] Phone: [phone]   Fax:  [phone]  Name: Andres Howard MRN: [id number] Date of Birth: 30-Dec-1965

## 2016-03-10 ENCOUNTER — Encounter: Payer: Self-pay | Admitting: Physical Therapy

## 2016-03-10 ENCOUNTER — Ambulatory Visit: Payer: BLUE CROSS/BLUE SHIELD | Admitting: Physical Therapy

## 2016-03-10 DIAGNOSIS — R2231 Localized swelling, mass and lump, right upper limb: Secondary | ICD-10-CM

## 2016-03-10 DIAGNOSIS — M25511 Pain in right shoulder: Secondary | ICD-10-CM

## 2016-03-10 DIAGNOSIS — M25611 Stiffness of right shoulder, not elsewhere classified: Secondary | ICD-10-CM | POA: Diagnosis not present

## 2016-03-10 NOTE — Therapy (Signed)
[place], [place], [address] Phone: [phone]   Fax:  [phone]  Physical Therapy Treatment  Patient Details  Name: Andres Howard MRN: [id number] Date of Birth: 08-07-1966 Referring Provider: Dr. Marlou Sa  Encounter Date: 03/10/2016      PT End of Session - 03/10/16 1007    Visit Number 11   Date for PT Re-Evaluation 04/01/16   PT Start Time 0931   PT Stop Time 1019   PT Time Calculation (min) 48 min   Activity Tolerance Patient tolerated treatment well   Behavior During Therapy [facility] for tasks assessed/performed      Past Medical History  Diagnosis Date  . Headache     Past Surgical History  Procedure Laterality Date  . Rotator cuff repair    . Anterior cruciate ligament repair    . Leg surgery    . Hernia repair    . Shoulder arthroscopy with rotator cuff repair and subacromial decompression Right 01/14/2016    Procedure: RIGHT SHOULDER ARTHROSCOPY WITH SUBACROMIAL DECOMPRESSION AND MINI OPEN ROTATOR CUFF REPAIR, BICEPS TENODESIS;  Surgeon: Meredith Pel, MD;  Location: [place];  Service: Orthopedics;  Laterality: Right;    There were no vitals filed for this visit.      Subjective Assessment - 03/10/16 0936    Subjective "Pretty good"   Currently in Pain? Yes   Pain Score 4   "I slept on it wrong"                         [facility] Adult PT Treatment/Exercise - 03/10/16 0001    Shoulder Exercises: Standing   External Rotation 15 reps;Theraband   Theraband Level (Shoulder External Rotation) Level 3 (Green)   Internal Rotation 15 reps   Theraband Level (Shoulder Internal Rotation) Level 3 (Green)   Flexion 10 reps;AROM  x2   ABduction AROM;10 reps  x2   Extension 15 reps;Theraband  x2   Theraband Level (Shoulder Extension) Level 3 (Green)   Row [employer] reps  x22   Theraband Level (Shoulder Row) Level 3 (Green)   Other Standing Exercises Bicep curl  #3 2x15    Shoulder Exercises: ROM/Strengthening   UBE (Upper Arm Bike) L 1 33fd/3rev   Shoulder Exercises: Power [employer]10 reps  x2   Row Limitations 25lb   Other Power Tower Exercises Lats #25 2x10    Vasopneumatic   Number Minutes Vasopneumatic  10 minutes   Vasopnuematic Location  Shoulder   Vasopneumatic Pressure Medium   Vasopneumatic Temperature  34                  PT Short Term Goals - 02/29/16 00086   PT SHORT TERM GOAL #1   Title independent with initial HEP   Status Achieved           PT Long Term Goals - 03/10/16 0951    PT LONG TERM GOAL #1   Title decrease pain 50%   Status Partially Met   PT LONG TERM GOAL #2   Status On-going   PT LONG TERM GOAL #3   Title dress without difficulty   Status Achieved   PT LONG TERM GOAL #4   Title increase AROM of the right shoulder to 140 degrees flexion   Status Achieved   PT LONG TERM GOAL #5   Title increase AROM of right shoulder ER/IR to  65 degrees   Status On-going               Plan - 03/10/16 1010    Clinical Impression Statement Pt continues to do well in PT, Pt has good PROM with MT, IR and ER limited today. No increase in pain. Pt elected only 10 in of ice post treatment.   PT Frequency 2x / week   PT Duration 8 weeks   PT Treatment/Interventions ADLs/Self Care Home Management;Electrical Stimulation;Cryotherapy;Therapeutic activities;Therapeutic exercise;Manual techniques;Vasopneumatic Device;Patient/family education   PT Next Visit Plan Light strengthening and ROM      Patient will benefit from skilled therapeutic intervention in order to improve the following deficits and impairments:  Decreased range of motion, Decreased strength, Increased edema, Increased muscle spasms, Postural dysfunction, Improper body mechanics, Pain  Visit Diagnosis: Stiffness of right shoulder, not elsewhere classified  Localized swelling, mass and lump, right upper limb  Pain in right  shoulder     Problem List There are no active problems to display for this patient.   Scot Jun, PTA  03/10/2016, 10:11 AM  [address] [place], [place], [address] Phone: [phone]   Fax:  [phone]  Name: ASHAAD GAERTNER MRN: [id number] Date of Birth: 12-Aug-1966

## 2016-03-14 ENCOUNTER — Encounter: Payer: Self-pay | Admitting: Physical Therapy

## 2016-03-14 ENCOUNTER — Ambulatory Visit: Payer: BLUE CROSS/BLUE SHIELD | Admitting: Physical Therapy

## 2016-03-14 DIAGNOSIS — M25411 Effusion, right shoulder: Secondary | ICD-10-CM

## 2016-03-14 DIAGNOSIS — M25611 Stiffness of right shoulder, not elsewhere classified: Secondary | ICD-10-CM | POA: Diagnosis not present

## 2016-03-14 NOTE — Therapy (Signed)
[place], [place], [address] Phone: [phone]   Fax:  [phone]  Physical Therapy Treatment  Patient Details  Name: Andres Howard MRN: [id number] Date of Birth: January 07, 1966 Referring Provider: Dr. Marlou Sa  Encounter Date: 03/14/2016      PT End of Session - 03/14/16 1227    Visit Number 12   Date for PT Re-Evaluation 04/01/16   PT Start Time 1150   PT Stop Time 1245   PT Time Calculation (min) 55 min      Past Medical History  Diagnosis Date  . Headache     Past Surgical History  Procedure Laterality Date  . Rotator cuff repair    . Anterior cruciate ligament repair    . Leg surgery    . Hernia repair    . Shoulder arthroscopy with rotator cuff repair and subacromial decompression Right 01/14/2016    Procedure: RIGHT SHOULDER ARTHROSCOPY WITH SUBACROMIAL DECOMPRESSION AND MINI OPEN ROTATOR CUFF REPAIR, BICEPS TENODESIS;  Surgeon: Meredith Pel, MD;  Location: [facility];  Service: Orthopedics;  Laterality: Right;    There were no vitals filed for this visit.      Subjective Assessment - 03/14/16 1150    Subjective doing good   Currently in Pain? No/denies                         [facility] Adult PT Treatment/Exercise - 03/14/16 0001    Shoulder Exercises: Seated   Other Seated Exercises 25# lat pull and seated row 2 sets 15   Other Seated Exercises press with serratus 15# 2 sets 10   Shoulder Exercises: Standing   External Rotation Strengthening;Both;10 reps  2 sets PNF 3#, 2 sets 10 red tband   Extension Both;10 reps;Weights  2 sets   Extension Weight (lbs) 5   Other Standing Exercises tricep ext machine 35# 2 sets 15  yellow tband 3 way stab 15 reps   Other Standing Exercises bicep curl 10# 3 sets 10  3# cabinet reaching flex and abd 10 each   Shoulder Exercises: Therapy Ball   Other Therapy Ball Exercises ball vs wall 5 times CC and CW   Shoulder  Exercises: ROM/Strengthening   UBE (Upper Arm Bike) L 2 3 fwd/3 back   Vasopneumatic   Number Minutes Vasopneumatic  15 minutes   Vasopnuematic Location  Shoulder   Vasopneumatic Pressure Medium   Vasopneumatic Temperature  34   Manual Therapy   Manual Therapy Passive ROM;Joint mobilization   Manual therapy comments PROM taken to end  range and held   Passive ROM R shoulder all directions                  PT Short Term Goals - 02/29/16 1324    PT SHORT TERM GOAL #1   Title independent with initial HEP   Status Achieved           PT Long Term Goals - 03/10/16 0951    PT LONG TERM GOAL #1   Title decrease pain 50%   Status Partially Met   PT LONG TERM GOAL #2   Status On-going   PT LONG TERM GOAL #3   Title dress without difficulty   Status Achieved   PT LONG TERM GOAL #4   Title increase AROM of the right shoulder to 140 degrees flexion   Status Achieved   PT LONG TERM GOAL #5  Title increase AROM of right shoulder ER/IR to 65 degrees   Status On-going               Plan - 03/14/16 1228    Clinical Impression Statement tolerated new exercises well, minimal cuing for correct tech. minimal increase in discomfort but denied pain. end range tightness all directions   PT Next Visit Plan assess how shld felt with add'l ther ex and progress. Check goals and ROM      Patient will benefit from skilled therapeutic intervention in order to improve the following deficits and impairments:     Visit Diagnosis: Swelling of joint of shoulder region, right  Stiffness of right shoulder, not elsewhere classified     Problem List There are no active problems to display for this patient.   Edy Belt,ANGIE PTA 03/14/2016, 12:31 PM  [place] [address] [place], [place], [address] Phone: [phone]   Fax:  [phone]  Name: AIDIN DOANE MRN: [id number] Date of Birth: 24-Jan-1966

## 2016-03-16 ENCOUNTER — Ambulatory Visit: Payer: BLUE CROSS/BLUE SHIELD | Admitting: Physical Therapy

## 2016-03-17 ENCOUNTER — Ambulatory Visit: Payer: BLUE CROSS/BLUE SHIELD | Admitting: Physical Therapy

## 2016-03-21 ENCOUNTER — Ambulatory Visit: Payer: BLUE CROSS/BLUE SHIELD | Admitting: Physical Therapy

## 2016-03-21 ENCOUNTER — Encounter: Payer: Self-pay | Admitting: Physical Therapy

## 2016-03-21 DIAGNOSIS — R2231 Localized swelling, mass and lump, right upper limb: Secondary | ICD-10-CM

## 2016-03-21 DIAGNOSIS — M25411 Effusion, right shoulder: Secondary | ICD-10-CM

## 2016-03-21 DIAGNOSIS — M25611 Stiffness of right shoulder, not elsewhere classified: Secondary | ICD-10-CM

## 2016-03-21 NOTE — Therapy (Signed)
[place], [place], [address] Phone: [phone]   Fax:  [phone]  Physical Therapy Treatment  Patient Details  Name: BRODHI Howard MRN: [id number] Date of Birth: 1966-02-13 Referring Provider: Dr. Marlou Sa  Encounter Date: 03/21/2016      PT End of Session - 03/21/16 0928    Visit Number 13   Date for PT Re-Evaluation 04/01/16   PT Start Time 0847   PT Stop Time 0942   PT Time Calculation (min) 55 min   Activity Tolerance Patient tolerated treatment well   Behavior During Therapy [facility] for tasks assessed/performed      Past Medical History  Diagnosis Date  . Headache     Past Surgical History  Procedure Laterality Date  . Rotator cuff repair    . Anterior cruciate ligament repair    . Leg surgery    . Hernia repair    . Shoulder arthroscopy with rotator cuff repair and subacromial decompression Right 01/14/2016    Procedure: RIGHT SHOULDER ARTHROSCOPY WITH SUBACROMIAL DECOMPRESSION AND MINI OPEN ROTATOR CUFF REPAIR, BICEPS TENODESIS;  Surgeon: Meredith Pel, MD;  Location: [facility];  Service: Orthopedics;  Laterality: Right;    There were no vitals filed for this visit.      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 "It not hurt just sore"   Currently in Pain? No/denies   Pain Score 0-No pain            [facility] PT Assessment - 03/21/16 0001    AROM   AROM Assessment Site Shoulder   Right/Left Shoulder Right   Right Shoulder Flexion 168 Degrees   Right Shoulder ABduction 149 Degrees   Right Shoulder Internal Rotation 71 Degrees   Right Shoulder External Rotation 75 Degrees                     [facility] Adult PT Treatment/Exercise - 03/21/16 0001    Shoulder Exercises: Seated   Other Seated Exercises 25# lat pull and seated row 2 sets 15   Other Seated Exercises press with serratus 15# 2 sets  10   Shoulder Exercises: Standing   Other Standing Exercises triceps ext machine 35# 2 sets 15; 3 way scap stab yellow Tbsnd 2x10    Other Standing Exercises bicep curl 10# 3 sets 10; 3 level cabinet reach #3 x10 flex and abd   Shoulder Exercises: ROM/Strengthening   UBE (Upper Arm Bike) L 2 3 fwd/3 back   Vasopneumatic   Number Minutes Vasopneumatic  15 minutes   Vasopnuematic Location  Shoulder   Vasopneumatic Pressure Medium   Vasopneumatic Temperature  34   Manual Therapy   Manual Therapy Passive ROM;Joint mobilization   Manual therapy comments PROM taken to end  range and held   Passive ROM R shoulder all directions                  PT Short Term Goals - 02/29/16 [id number]    PT SHORT TERM GOAL #1   Title independent with initial HEP   Status Achieved           PT Long Term Goals - 03/21/16 0930    PT LONG TERM GOAL #2   Title increase strength to lift 5# over head   Status On-going   PT LONG TERM GOAL #5  Title increase AROM of right shoulder ER/IR to 65 degrees   Status Achieved               Plan - 03/21/16 0928    Clinical Impression Statement All AROm taken in supine. Pt has increase R shoulder AROM and completed some goals. X=Demos good strength with exercises, does have a struggle with 3 way cabinet reaches in abduction.    Rehab Potential Good   PT Frequency 2x / week   PT Duration 8 weeks   PT Treatment/Interventions ADLs/Self Care Home Management;Electrical Stimulation;Cryotherapy;Therapeutic activities;Therapeutic exercise;Manual techniques;Vasopneumatic Device;Patient/family education   PT Next Visit Plan progress as tolerated      Patient will benefit from skilled therapeutic intervention in order to improve the following deficits and impairments:  Decreased range of motion, Decreased strength, Increased edema, Increased muscle spasms, Postural dysfunction, Improper body mechanics, Pain  Visit Diagnosis: Swelling of joint of shoulder  region, right  Stiffness of right shoulder, not elsewhere classified  Localized swelling, mass and lump, right upper limb     Problem List There are no active problems to display for this patient.   Scot Jun, PTA  03/21/2016, 9:30 AM  [place] [address] [place], [place], [address] Phone: [phone]   Fax:  [phone]  Name: Andres Howard MRN: [id number] Date of Birth: 1965-11-30

## 2016-03-23 ENCOUNTER — Encounter: Payer: Self-pay | Admitting: Physical Therapy

## 2016-03-23 ENCOUNTER — Ambulatory Visit: Payer: BLUE CROSS/BLUE SHIELD | Admitting: Physical Therapy

## 2016-03-23 DIAGNOSIS — M25611 Stiffness of right shoulder, not elsewhere classified: Secondary | ICD-10-CM | POA: Diagnosis not present

## 2016-03-23 DIAGNOSIS — M25411 Effusion, right shoulder: Secondary | ICD-10-CM

## 2016-03-23 DIAGNOSIS — R2231 Localized swelling, mass and lump, right upper limb: Secondary | ICD-10-CM

## 2016-03-23 NOTE — Therapy (Signed)
[place], [place], [address] Phone: [phone]   Fax:  [phone]  Physical Therapy Treatment  Patient Details  Name: Andres Howard MRN: [id number] Date of Birth: [birth year] Referring Provider: Dr. Marlou Sa  Encounter Date: 03/23/2016      PT End of Session - 03/23/16 0928    Visit Number 14   Date for PT Re-Evaluation 04/01/16   PT Start Time 0848   PT Stop Time 0942   PT Time Calculation (min) 54 min   Activity Tolerance Patient tolerated treatment well      Past Medical History  Diagnosis Date  . Headache     Past Surgical History  Procedure Laterality Date  . Rotator cuff repair    . Anterior cruciate ligament repair    . Leg surgery    . Hernia repair    . Shoulder arthroscopy with rotator cuff repair and subacromial decompression Right 01/14/2016    Procedure: RIGHT SHOULDER ARTHROSCOPY WITH SUBACROMIAL DECOMPRESSION AND MINI OPEN ROTATOR CUFF REPAIR, BICEPS TENODESIS;  Surgeon: Meredith Pel, MD;  Location: [facility];  Service: Orthopedics;  Laterality: Right;    There were no vitals filed for this visit.      Subjective Assessment - 03/23/16 0850    Subjective "Wore out" Pt reports that his arm  bothered him yesterday and last night "Couldn't get comfortable"   Currently in Pain? Yes   Pain Score 7    Pain Location Shoulder   Pain Orientation Right                         [facility] Adult PT Treatment/Exercise - 03/23/16 0001    Shoulder Exercises: Standing   External Rotation Both;15 reps  x2   Theraband Level (Shoulder External Rotation) Level 3 (Green)   Internal Rotation 15 reps;Theraband;Right  x2   Theraband Level (Shoulder Internal Rotation) Level 3 (Green)   Flexion Weights;Both;15 reps  x2   Shoulder Flexion Weight (lbs) 2   ABduction Strengthening;Both;15 reps  x2   Shoulder ABduction Weight (lbs) 2   Row [employer] reps;Both  x2   Row  Weight (lbs) 35   Other Standing Exercises Standing straight arm pulldowns #35 2x15; tricep ext machine 35# 2 sets 15; 3 way scap stabe yellow Tbsnd 2x10    Other Standing Exercises bicep curl 9# 2x15; 3 level cabinet reach #3 x10 flex and abd   Shoulder Exercises: ROM/Strengthening   UBE (Upper Arm Bike) L 2 3 fwd/3 back   Vasopneumatic   Number Minutes Vasopneumatic  15 minutes   Vasopnuematic Location  Shoulder   Vasopneumatic Pressure Medium   Vasopneumatic Temperature  34   Manual Therapy   Manual Therapy Passive ROM   Passive ROM R shoulder all directions                  PT Short Term Goals - 02/29/16 [id number]    PT SHORT TERM GOAL #1   Title independent with initial HEP   Status Achieved           PT Long Term Goals - 03/21/16 0930    PT LONG TERM GOAL #2   Title increase strength to lift 5# over head   Status On-going   PT LONG TERM GOAL #5   Title increase AROM of right shoulder ER/IR to 65 degrees   Status Achieved  Plan - 03/23/16 0928    Clinical Impression Statement Pt shows good strength and ROM with exercises. Able to complete stranding shoulder abd and flexion without shoulder elevation. Pt does reports that the pain has eased off during exercises.   Rehab Potential Good   PT Frequency 2x / week   PT Duration 8 weeks   PT Treatment/Interventions ADLs/Self Care Home Management;Electrical Stimulation;Cryotherapy;Therapeutic activities;Therapeutic exercise;Manual techniques;Vasopneumatic Device;Patient/family education   PT Next Visit Plan progress as tolerated      Patient will benefit from skilled therapeutic intervention in order to improve the following deficits and impairments:  Decreased range of motion, Decreased strength, Increased edema, Increased muscle spasms, Postural dysfunction, Improper body mechanics, Pain  Visit Diagnosis: Swelling of joint of shoulder region, right  Stiffness of right shoulder, not elsewhere  classified  Localized swelling, mass and lump, right upper limb     Problem List There are no active problems to display for this patient.   Scot Jun, PTA  03/23/2016, 9:32 AM  [place], [place], [address] Phone: [phone]   Fax:  [phone]  Name: BABAJIDE RUFENACHT MRN: [id number] Date of Birth: 17-Jun-1966

## 2016-03-28 ENCOUNTER — Encounter: Payer: Self-pay | Admitting: Physical Therapy

## 2016-03-28 ENCOUNTER — Ambulatory Visit: Payer: BLUE CROSS/BLUE SHIELD | Admitting: Physical Therapy

## 2016-03-28 DIAGNOSIS — M25611 Stiffness of right shoulder, not elsewhere classified: Secondary | ICD-10-CM | POA: Diagnosis not present

## 2016-03-28 DIAGNOSIS — R2231 Localized swelling, mass and lump, right upper limb: Secondary | ICD-10-CM

## 2016-03-28 DIAGNOSIS — M25411 Effusion, right shoulder: Secondary | ICD-10-CM

## 2016-03-28 NOTE — Therapy (Signed)
[place], [place], [address] Phone: [phone]   Fax:  [phone]  Physical Therapy Treatment  Patient Details  Name: Andres Howard MRN: [id number] Date of Birth: 07/29/1966 Referring Provider: Dr. Marlou Sa  Encounter Date: 03/28/2016      PT End of Session - 03/28/16 0929    Visit Number 15   Date for PT Re-Evaluation 04/01/16   PT Start Time 0845   PT Stop Time 0943   PT Time Calculation (min) 58 min   Activity Tolerance Patient tolerated treatment well   Behavior During Therapy [facility] for tasks assessed/performed      Past Medical History  Diagnosis Date  . Headache     Past Surgical History  Procedure Laterality Date  . Rotator cuff repair    . Anterior cruciate ligament repair    . Leg surgery    . Hernia repair    . Shoulder arthroscopy with rotator cuff repair and subacromial decompression Right 01/14/2016    Procedure: RIGHT SHOULDER ARTHROSCOPY WITH SUBACROMIAL DECOMPRESSION AND MINI OPEN ROTATOR CUFF REPAIR, BICEPS TENODESIS;  Surgeon: Meredith Pel, MD;  Location: [facility];  Service: Orthopedics;  Laterality: Right;    There were no vitals filed for this visit.      Subjective Assessment - 03/28/16 0846    Subjective "Its coming alone I can feel it, but its still healing "   Currently in Pain? Yes   Pain Score 6    Pain Location Shoulder   Pain Orientation Right                         [facility] Adult PT Treatment/Exercise - 03/28/16 0001    Shoulder Exercises: Seated   Other Seated Exercises Brent over rev flys #5 2x10   Other Seated Exercises press with serratus 15# 2 sets 10; Lat pull downs #35 2x15   Shoulder Exercises: Standing   External Rotation Both;15 reps  x2   Theraband Level (Shoulder External Rotation) Level 3 (Green)   Internal Rotation 15 reps;Theraband;Right  x2   Theraband Level (Shoulder Internal Rotation) Level 3 (Green)    Flexion Weights;Both;15 reps  x2    Shoulder Flexion Weight (lbs) 4   ABduction Strengthening;Both;15 reps  x2   Shoulder ABduction Weight (lbs) 3   Row [employer] reps;Both  x2   Row Weight (lbs) 35   Other Standing Exercises Standing straight arm pulldowns #35 2x15; tricep ext machine 35# 2 sets 15; 3 way scap stabe green Tband 2x10    Other Standing Exercises bicep curl 9# 2x15; 3 level cabinet reach #4 2x10 flex and abd   Shoulder Exercises: ROM/Strengthening   UBE (Upper Arm Bike) L 4 3 fwd/3 back   Vasopneumatic   Number Minutes Vasopneumatic  15 minutes   Vasopnuematic Location  Shoulder   Vasopneumatic Pressure Medium   Vasopneumatic Temperature  34                  PT Short Term Goals - 02/29/16 [id number]    PT SHORT TERM GOAL #1   Title independent with initial HEP   Status Achieved           PT Long Term Goals - 03/21/16 0930    PT LONG TERM GOAL #2   Title increase strength to lift 5# over head   Status On-going   PT LONG TERM GOAL #5   Title increase  AROM of right shoulder ER/IR to 65 degrees   Status Achieved               Plan - 03/28/16 0929    Clinical Impression Statement Pt continues to do well, no c/o of increase pain or issues with exercises.   Rehab Potential Good   PT Frequency 2x / week   PT Duration 8 weeks   PT Treatment/Interventions ADLs/Self Care Home Management;Electrical Stimulation;Cryotherapy;Therapeutic activities;Therapeutic exercise;Manual techniques;Vasopneumatic Device;Patient/family education   PT Next Visit Plan progress as tolerated      Patient will benefit from skilled therapeutic intervention in order to improve the following deficits and impairments:  Decreased range of motion, Decreased strength, Increased edema, Increased muscle spasms, Postural dysfunction, Improper body mechanics, Pain  Visit Diagnosis: Swelling of joint of shoulder region, right  Stiffness of right shoulder, not elsewhere  classified  Localized swelling, mass and lump, right upper limb     Problem List There are no active problems to display for this patient.   Scot Jun, PTA  03/28/2016, 9:32 AM  [place], [place], [address] Phone: [phone]   Fax:  [phone]  Name: Andres Howard MRN: [id number] Date of Birth: 03-09-1966

## 2016-03-30 ENCOUNTER — Ambulatory Visit: Payer: BLUE CROSS/BLUE SHIELD | Admitting: Physical Therapy

## 2016-03-30 ENCOUNTER — Encounter: Payer: Self-pay | Admitting: Physical Therapy

## 2016-03-30 DIAGNOSIS — M25611 Stiffness of right shoulder, not elsewhere classified: Secondary | ICD-10-CM

## 2016-03-30 DIAGNOSIS — R2231 Localized swelling, mass and lump, right upper limb: Secondary | ICD-10-CM

## 2016-03-30 DIAGNOSIS — M25411 Effusion, right shoulder: Secondary | ICD-10-CM

## 2016-03-30 NOTE — Therapy (Signed)
[place], [place], [address] Phone: [phone]   Fax:  [phone]  Physical Therapy Treatment  Patient Details  Name: Andres Howard MRN: [id number] Date of Birth: 05/11/1966 Referring Provider: Dr. Marlou Sa  Encounter Date: 03/30/2016      PT End of Session - 03/30/16 0927    Visit Number 16   Date for PT Re-Evaluation 04/01/16   PT Start Time 0845   PT Stop Time 0941   PT Time Calculation (min) 56 min   Activity Tolerance Patient tolerated treatment well   Behavior During Therapy [facility] for tasks assessed/performed      Past Medical History  Diagnosis Date  . Headache     Past Surgical History  Procedure Laterality Date  . Rotator cuff repair    . Anterior cruciate ligament repair    . Leg surgery    . Hernia repair    . Shoulder arthroscopy with rotator cuff repair and subacromial decompression Right 01/14/2016    Procedure: RIGHT SHOULDER ARTHROSCOPY WITH SUBACROMIAL DECOMPRESSION AND MINI OPEN ROTATOR CUFF REPAIR, BICEPS TENODESIS;  Surgeon: Meredith Pel, MD;  Location: [facility];  Service: Orthopedics;  Laterality: Right;    There were no vitals filed for this visit.      Subjective Assessment - 03/30/16 0849    Subjective Pt reports that he is sore but is good. Pt also reports that he has had some spasms in his R shoulder last night.   Currently in Pain? Yes   Pain Score 5    Pain Location Shoulder   Pain Orientation Right                         [facility] Adult PT Treatment/Exercise - 03/30/16 0001    Shoulder Exercises: Seated   Other Seated Exercises Brent over rev fly's #5 2x15; Seated face pull #45 2x15    Other Seated Exercises press with serratus 20# 2 sets 10 reps; Rows & Lat pull downs #35 2x15   Shoulder Exercises: Standing   External Rotation Both;20 reps   Theraband Level (Shoulder External Rotation) Level 3 (Green)   Internal Rotation  Theraband;Right;20 reps   Theraband Level (Shoulder Internal Rotation) Level 3 (Green)   Flexion Weights;Both;10 reps  x2   Shoulder Flexion Weight (lbs) 5   ABduction Strengthening;Both;15 reps  x2   Shoulder ABduction Weight (lbs) 4   Other Standing Exercises 3 way scap stabe green Tband 2x10    Other Standing Exercises bicep curl 9# 2x15; 3 level cabinet reach #4 2x10 flex and abd   Shoulder Exercises: ROM/Strengthening   UBE (Upper Arm Bike) L 4.5 3 fwd/3 back   Vasopneumatic   Number Minutes Vasopneumatic  15 minutes   Vasopnuematic Location  Shoulder   Vasopneumatic Pressure Medium   Vasopneumatic Temperature  34                  PT Short Term Goals - 02/29/16 [id number]    PT SHORT TERM GOAL #1   Title independent with initial HEP   Status Achieved           PT Long Term Goals - 03/21/16 0930    PT LONG TERM GOAL #2   Title increase strength to lift 5# over head   Status On-going   PT LONG TERM GOAL #5   Title increase AROM of right shoulder ER/IR to 65 degrees  Status Achieved               Plan - 03/30/16 0927    Clinical Impression Statement Pt has with some R shoulder elevation with 5lb shoulder flexion in addition to some weakness with resisted R shoulder ER. No reports of increase pain.    Rehab Potential Good   PT Frequency 2x / week   PT Duration 8 weeks   PT Next Visit Plan progress as tolerated      Patient will benefit from skilled therapeutic intervention in order to improve the following deficits and impairments:  Decreased range of motion, Decreased strength, Increased edema, Increased muscle spasms, Postural dysfunction, Improper body mechanics, Pain  Visit Diagnosis: Swelling of joint of shoulder region, right  Stiffness of right shoulder, not elsewhere classified  Localized swelling, mass and lump, right upper limb     Problem List There are no active problems to display for this patient.   Scot Jun, PTA   03/30/2016, 9:29 AM  [place] [id number] [place], [place], [address] Phone: [phone]   Fax:  [phone]  Name: Andres Howard MRN: [id number] Date of Birth: [birth year]

## 2016-04-05 ENCOUNTER — Ambulatory Visit: Payer: BLUE CROSS/BLUE SHIELD | Admitting: Physical Therapy

## 2016-04-07 ENCOUNTER — Ambulatory Visit: Payer: BLUE CROSS/BLUE SHIELD | Admitting: Physical Therapy

## 2016-04-11 ENCOUNTER — Encounter: Payer: Self-pay | Admitting: Physical Therapy

## 2016-04-11 ENCOUNTER — Ambulatory Visit: Payer: BLUE CROSS/BLUE SHIELD | Attending: Orthopedic Surgery | Admitting: Physical Therapy

## 2016-04-11 DIAGNOSIS — M25411 Effusion, right shoulder: Secondary | ICD-10-CM | POA: Diagnosis not present

## 2016-04-11 DIAGNOSIS — M25611 Stiffness of right shoulder, not elsewhere classified: Secondary | ICD-10-CM | POA: Diagnosis present

## 2016-04-11 DIAGNOSIS — R2231 Localized swelling, mass and lump, right upper limb: Secondary | ICD-10-CM | POA: Diagnosis present

## 2016-04-11 NOTE — Therapy (Signed)
[place], [place], [address] Phone: [phone]   Fax:  [phone]  Physical Therapy Treatment  Patient Details  Name: Andres Howard MRN: [id number] Date of Birth: Mar 29, 1966 Referring Provider: Dr. Marlou Sa  Encounter Date: 04/11/2016      PT End of Session - 04/11/16 0844    Visit Number 17   Date for PT Re-Evaluation 04/01/16   PT Start Time 0803   PT Stop Time 0859   PT Time Calculation (min) 56 min      Past Medical History  Diagnosis Date  . Headache     Past Surgical History  Procedure Laterality Date  . Rotator cuff repair    . Anterior cruciate ligament repair    . Leg surgery    . Hernia repair    . Shoulder arthroscopy with rotator cuff repair and subacromial decompression Right 01/14/2016    Procedure: RIGHT SHOULDER ARTHROSCOPY WITH SUBACROMIAL DECOMPRESSION AND MINI OPEN ROTATOR CUFF REPAIR, BICEPS TENODESIS;  Surgeon: Meredith Pel, MD;  Location: [facility];  Service: Orthopedics;  Laterality: Right;    There were no vitals filed for this visit.      Subjective Assessment - 04/11/16 0806    Subjective "Doctor told me I had 6 more weeks"   Currently in Pain? No/denies   Pain Score 0-No pain            [facility] PT Assessment - 04/11/16 0001    AROM   AROM Assessment Site Shoulder   Right/Left Shoulder Right   Right Shoulder Flexion 178 Degrees   Right Shoulder ABduction 170 Degrees   Right Shoulder Internal Rotation 85 Degrees   Right Shoulder External Rotation 81 Degrees                     [facility] Adult PT Treatment/Exercise - 04/11/16 0001    Shoulder Exercises: Seated   Other Seated Exercises Brent over rev flys #5 2x15; Seated face pull #45 2x15; seated high low rows #445 2x15    Other Seated Exercises press with serratus 25# 2 sets 15 reps; Rows & Lat pull downs #45 2x15   Shoulder Exercises: Standing   Other Standing Exercises OHP #5 2x10;  3 way scap stabe green Tband 2x10    Other Standing Exercises bicep curl 9# 2x15; 3 level cabinet reach #4 2x10 flex and abd   Shoulder Exercises: ROM/Strengthening   UBE (Upper Arm Bike) L 5.5 3 fwd/3 back   Vasopneumatic   Number Minutes Vasopneumatic  15 minutes   Vasopnuematic Location  Shoulder   Vasopneumatic Pressure Medium   Vasopneumatic Temperature  34                  PT Short Term Goals - 02/29/16 0881    PT SHORT TERM GOAL #1   Title independent with initial HEP   Status Achieved           PT Long Term Goals - 04/11/16 0846    PT LONG TERM GOAL #2   Title increase strength to lift 5# over head   Status Partially Met   PT LONG TERM GOAL #4   Title increase AROM of the right shoulder to 140 degrees flexion   Status Achieved   PT LONG TERM GOAL #5   Title increase AROM of right shoulder ER/IR to 65 degrees   Status Achieved  Plan - 04/11/16 0844    Clinical Impression Statement Pt with improved AROM in all directions in supine positions. Demos good strength and ROM with all exercises, pt appeared to have some difficulty with seated face pulls with resistance.    Rehab Potential Good   PT Frequency 2x / week   PT Duration 8 weeks   PT Treatment/Interventions ADLs/Self Care Home Management;Electrical Stimulation;Cryotherapy;Therapeutic activities;Therapeutic exercise;Manual techniques;Vasopneumatic Device;Patient/family education   PT Next Visit Plan progress as tolerated      Patient will benefit from skilled therapeutic intervention in order to improve the following deficits and impairments:  Decreased range of motion, Decreased strength, Increased edema, Increased muscle spasms, Postural dysfunction, Improper body mechanics, Pain  Visit Diagnosis: Swelling of joint of shoulder region, right  Localized swelling, mass and lump, right upper limb  Stiffness of right shoulder, not elsewhere classified     Problem List There  are no active problems to display for this patient.   Scot Jun, PTA  04/11/2016, 8:47 AM  [place] [address] [place], [place], [address] Phone: [phone]   Fax:  [phone]  Name: Andres Howard MRN: [id number] Date of Birth: 1966/09/19

## 2016-04-13 ENCOUNTER — Ambulatory Visit: Payer: BLUE CROSS/BLUE SHIELD | Admitting: Physical Therapy

## 2016-04-19 ENCOUNTER — Ambulatory Visit: Payer: BLUE CROSS/BLUE SHIELD | Admitting: Physical Therapy

## 2016-07-07 IMAGING — DX DG ELBOW COMPLETE 3+V*R*
4 series · 4 of 4 positions shown · non-contrast
Comparison: None.

CLINICAL DATA: Fall this morning 8 feet from ladder. Right elbow
pain.

EXAM:
RIGHT ELBOW - COMPLETE 3+ VIEW

[elbow ap]
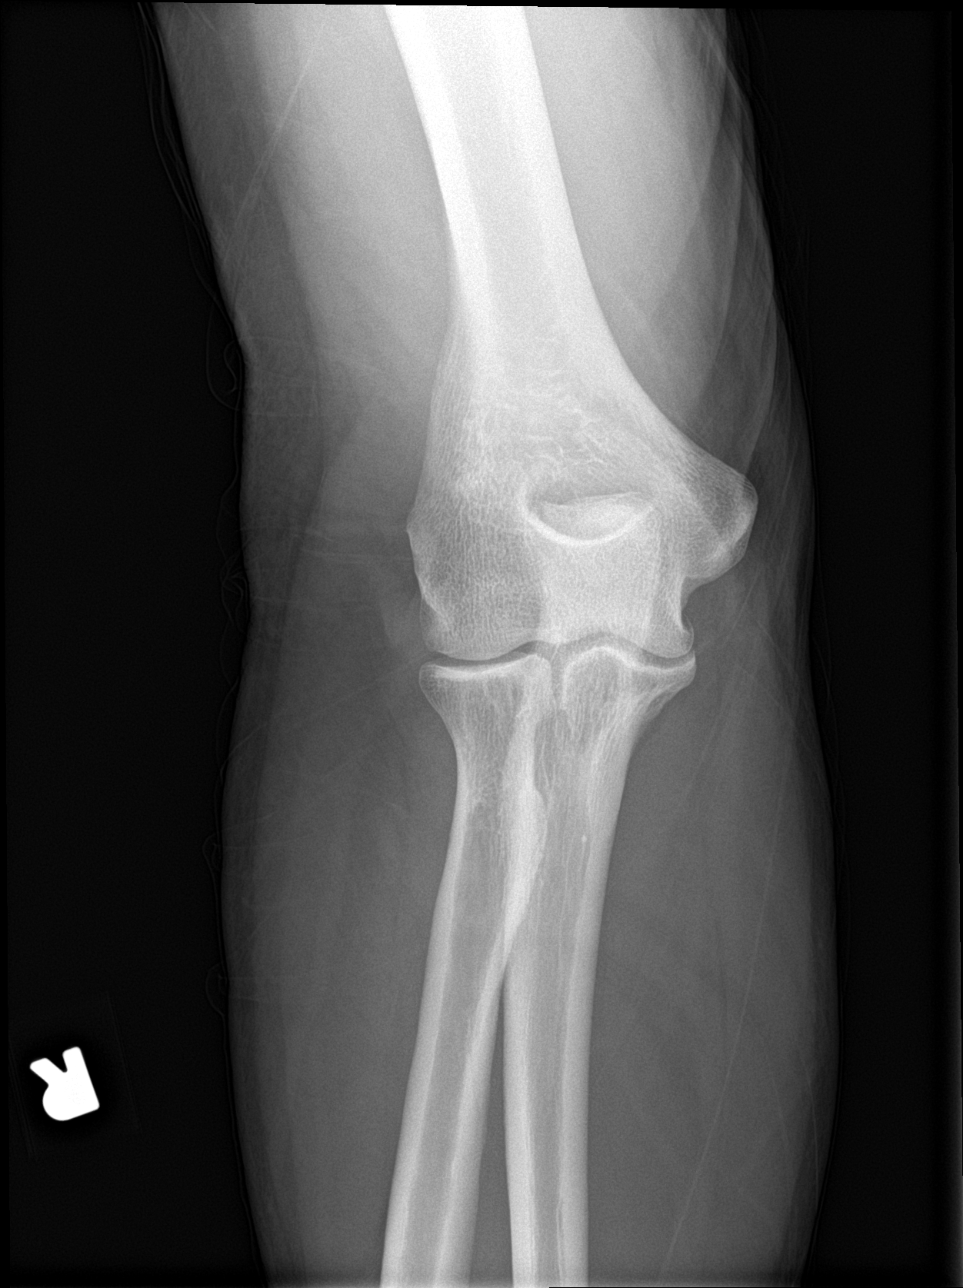

[elbow obl (1 of 2)]
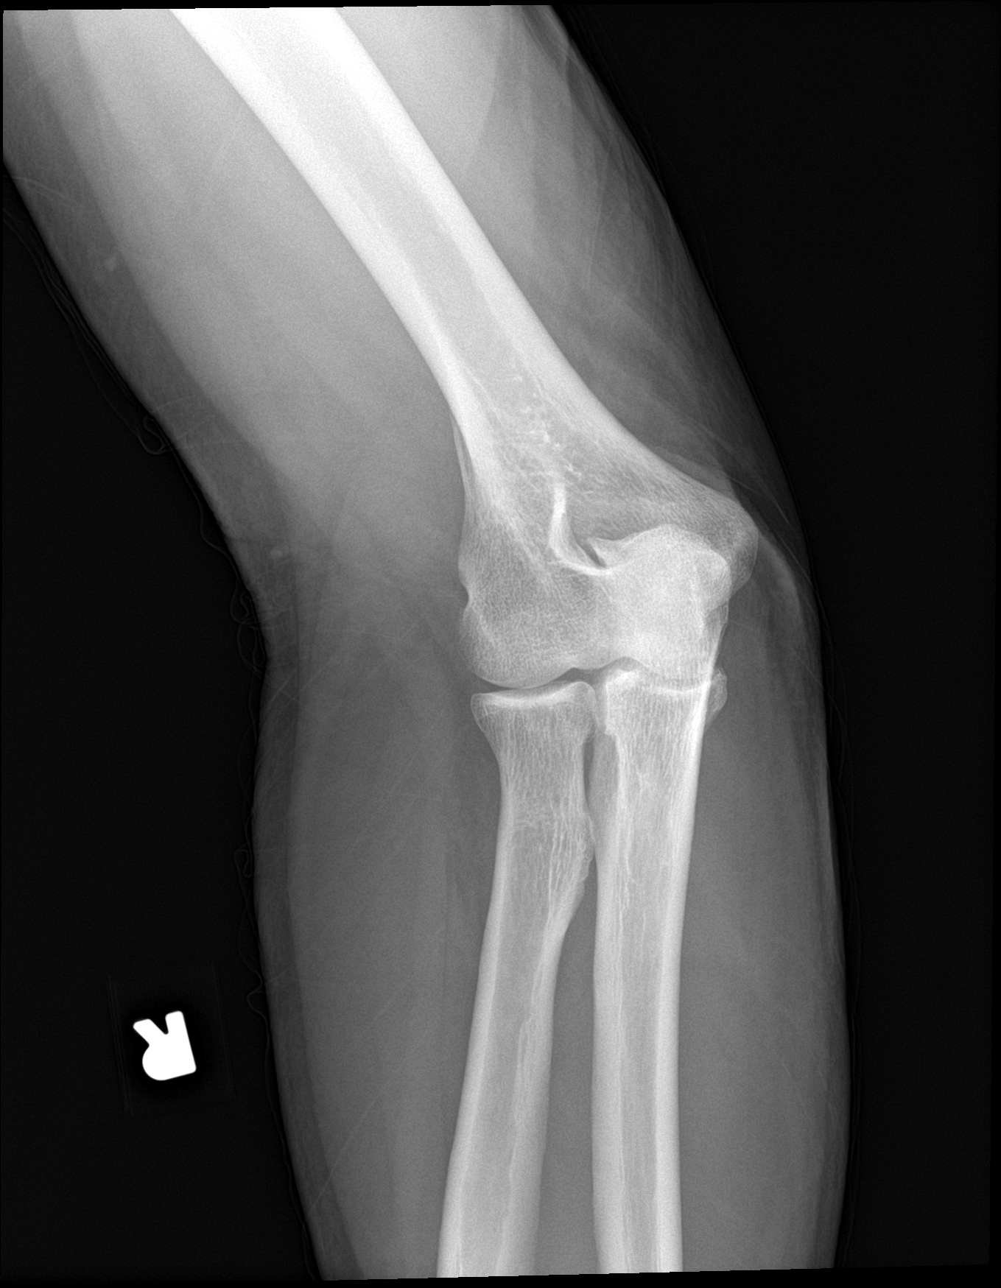

[elbow obl (2 of 2)]
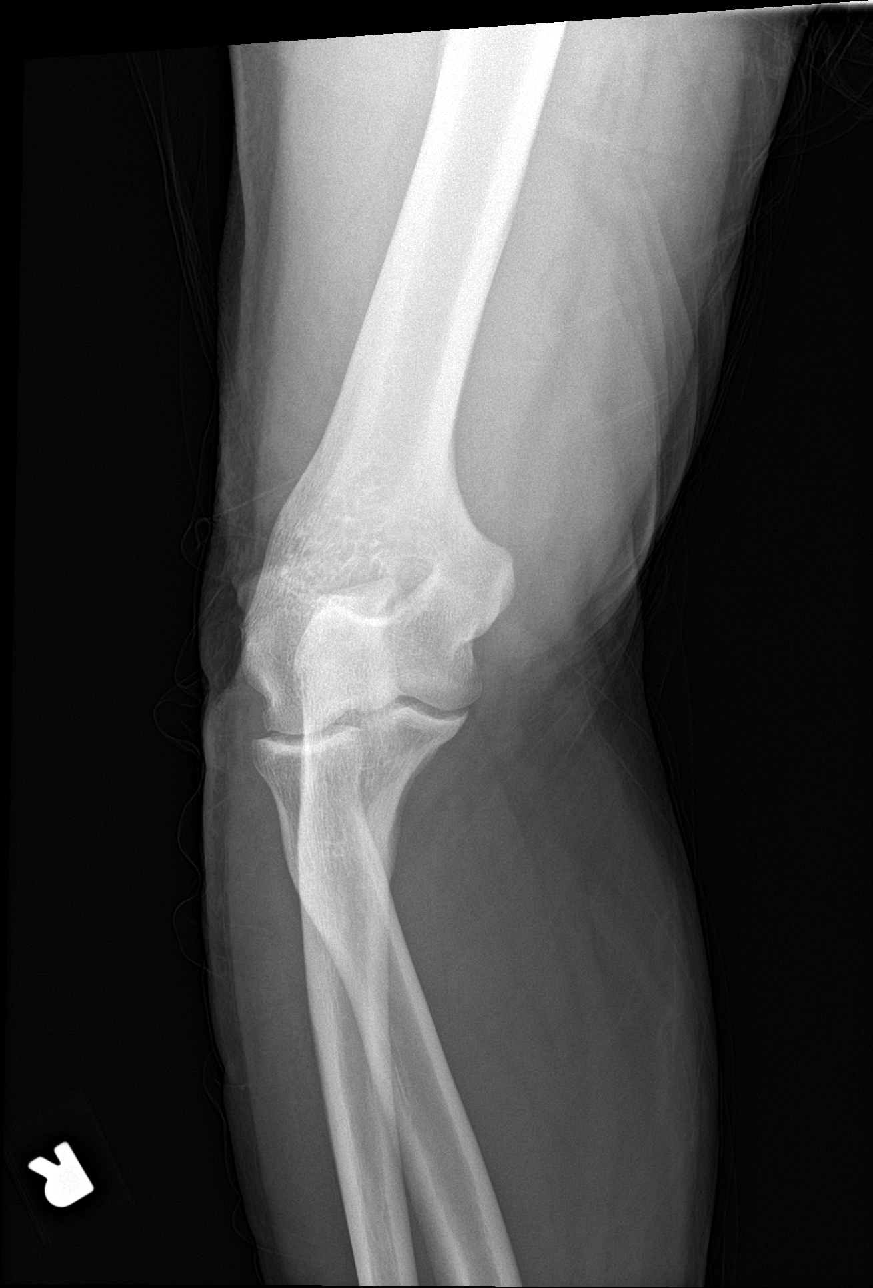

[elbow lat]
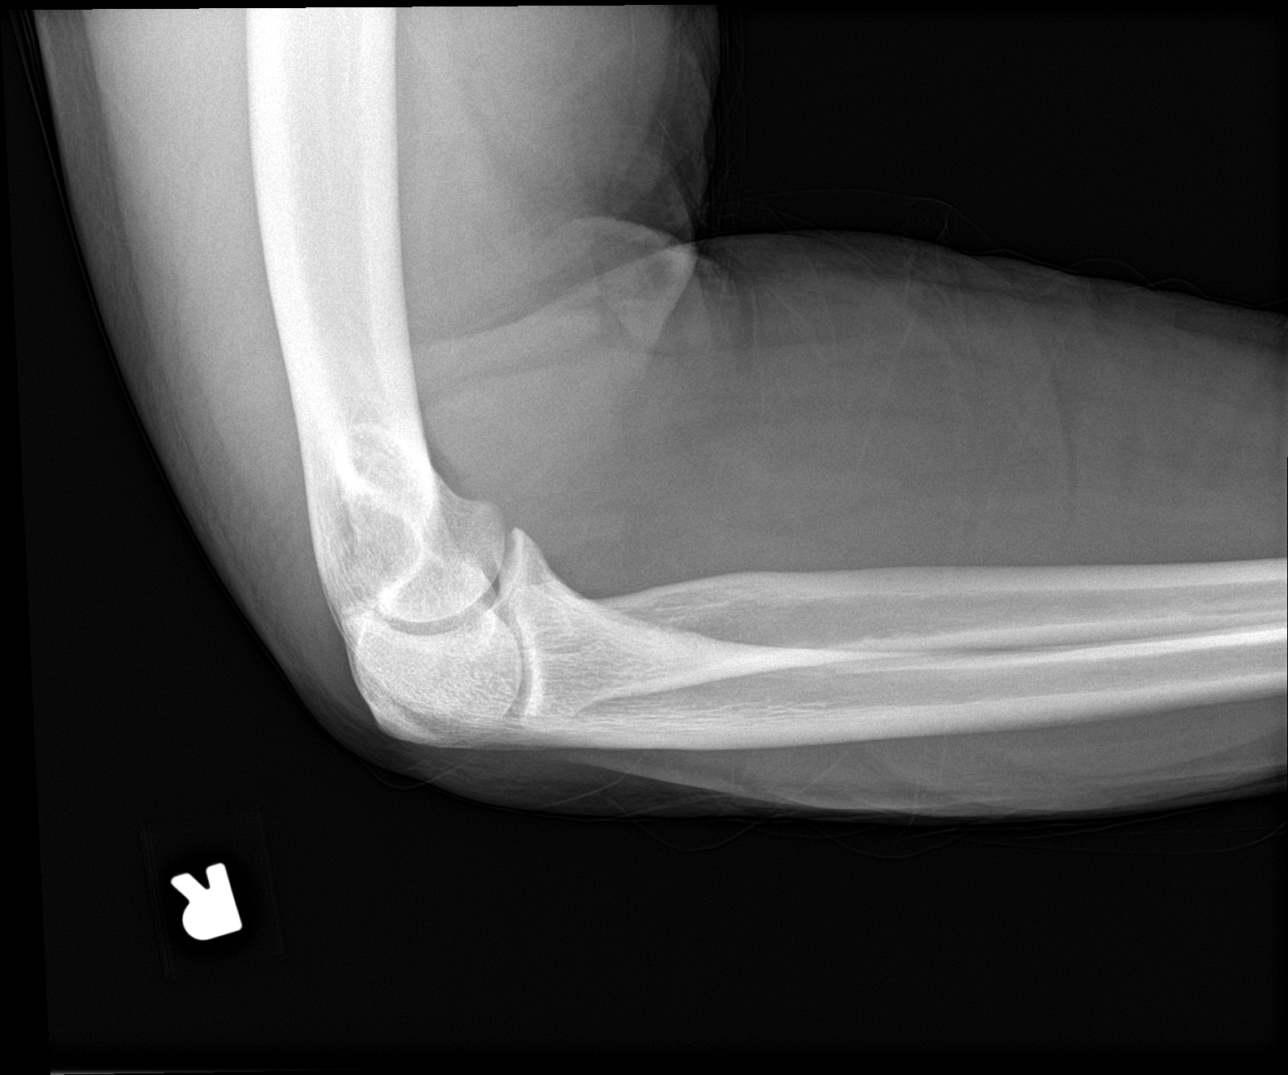

[4 of 4 positions shown; findings below may reference images not displayed]

FINDINGS: There is no evidence of fracture, dislocation, or joint effusion.
There is no evidence of arthropathy or other focal bone abnormality.
Soft tissues are unremarkable.
IMPRESSION: Negative.

## 2019-04-30 ENCOUNTER — Other Ambulatory Visit: Payer: Self-pay | Admitting: Otolaryngology

## 2019-04-30 DIAGNOSIS — K118 Other diseases of salivary glands: Secondary | ICD-10-CM

## 2020-01-26 ENCOUNTER — Other Ambulatory Visit: Payer: Self-pay | Admitting: Otolaryngology

## 2020-01-26 DIAGNOSIS — K118 Other diseases of salivary glands: Secondary | ICD-10-CM

## 2020-01-29 ENCOUNTER — Other Ambulatory Visit: Payer: BLUE CROSS/BLUE SHIELD

## 2020-02-06 ENCOUNTER — Other Ambulatory Visit: Payer: BLUE CROSS/BLUE SHIELD

## 2020-06-21 ENCOUNTER — Ambulatory Visit
Admission: RE | Admit: 2020-06-21 | Discharge: 2020-06-21 | Disposition: A | Discharge status: Home / Self Care | Payer: Self-pay | Source: Ambulatory Visit | Attending: Radiation Oncology | Admitting: Radiation Oncology

## 2020-06-21 ENCOUNTER — Other Ambulatory Visit: Payer: Self-pay

## 2020-06-21 DIAGNOSIS — C07 Malignant neoplasm of parotid gland: Secondary | ICD-10-CM

## 2020-06-21 NOTE — Progress Notes (Signed)
Oncology Nurse Navigator Documentation  +++Faxed request to [facility] for the following imaging to be pushed to [employer]:  . CT neck/thyroid 03/29/2020 . CT neck/thyroid 05/21/2019  Notification of successful fax transmission received. Order placed for assignment to [place].   Harlow Asa, RN, BSN, OCN Head & Neck Oncology Nurse [place] at [place] [phone]

## 2020-06-24 NOTE — Progress Notes (Signed)
Head and Neck Cancer Location of Tumor / Histology:  Cellular pleomorphicadenoma of RIGHT parotid gland  Patient presented with symptoms of:  Andres Howard has had a right parotid mass for at least 5 years, with slow steady enlargement over this time (more rapid growth in the last year). Has become somewhat painful there. He describes some numbness, tingling on the skin here  Biopsies revealed:  05/24/2020 FINAL PATHOLOGIC DIAGNOSIS  MICROSCOPIC EXAMINATION AND DIAGNOSIS  PAROTID MASS, RIGHT, SUPERFICIAL PAROTIDECTOMY:  -Carcinoma ex pleomorphic adenoma.  -Greatest dimension 7.0 cm.  -One benign lymph node.  -Margins negative.  -pT3, pN0.  COMMENT:  Sections demonstrate a nodular proliferation of bland tubular  epithelium within chondromyxoid type matrix, showing extensive  squamous metaplasia, psammomatous calcifications, and areas of  epithelial infiltration of the surrounding parotid gland. Muchof the histology is consistent with a cellular pleomorphicadenoma with atypical features, however based on unusualhistologic features and the reported clinical context, the casewas sent to the [facility] to assess for carcinoma expleomorphic adenoma.  The full report from the [facility] will be submitted for the  medical record. The report includes the following:  Right parotid gland, excision ([id number]; 05/24/2020): Carcinoma  ex pleomorphic adenoma. See comment. Immunohistochemical stains  performed at [facility] (block A4) show neoplastic cells arepositive for AE1/AE3, CAM5.2, p63, and S-100  Nutrition Status Yes No Comments  Weight changes? []  [x]    Swallowing concerns? []  [x]    PEG? []  [x]     Referrals Yes No Comments  Social Work? [x]  []    Dentistry? [x]  []    Swallowing therapy? [x]  []    Nutrition? [x]  []    Med/Onc? []  [x]     Safety Issues Yes No Comments  Prior radiation? []  [x]    Pacemaker/ICD? []  [x]    Possible current pregnancy? []  [x]    Is the  patient on methotrexate? []  [x]     Tobacco/Marijuana/Snuff/ETOH use: Former smoker. Smoked 1/2 pack daily until diagnosis. Occasionally drinks alcohol. No illicit drug use.   Past/Anticipated interventions by otolaryngology, if any:  05/24/2020 Dr. Francina Ames PAROTIDECTOMY (Right Neck)  Past/Anticipated interventions by medical oncology, if any:  No referral made to medical oncology at this time   Current Complaints / other details:  Nothing else of note

## 2020-06-25 NOTE — Progress Notes (Signed)
Oncology Nurse Navigator Documentation  Placed introductory call to new referral patient Andres Howard  Introduced myself as the H&N oncology nurse navigator that works with Dr. Isidore Moos to whom he has been referred by Dr. Nicolette Bang. He confirmed understanding of referral.  Briefly explained my role as his navigator, provided my contact information.   Confirmed understanding of upcoming appts. His consult with Dr. Isidore Moos will be on MyChart video  I explained the purpose of a dental evaluation prior to starting RT, indicated he wd be contacted by [facility] to arrange an appt.    I encouraged him to call with questions/concerns as he moves forward with appts and procedures.    He verbalized understanding of information provided, expressed appreciation for my call.   Navigator Initial Assessment . Employment Status: He is employed full time . Currently on FMLA / STD: No . Living Situation: He lives alone . Support System: He has a significant other, and his parents live in town.  Marland Kitchen PCP: Bayard Beaver PA . PCD: no . Financial Concerns: yes . Transportation Needs: no . Sensory Deficits: no . Language Barriers/Interpreter Needed:  no . Ambulation Needs: no . DME Used in Home: no . Psychosocial Needs:  no . Concerns/Needs Understanding Cancer:  addressed/answered by navigator to best of ability . Self-Expressed Needs: no  Harlow Asa RN, BSN, OCN Head & Neck Oncology Nurse [place] at [facility] Phone # [phone]  Fax # [phone]

## 2020-06-28 ENCOUNTER — Encounter: Payer: Self-pay | Admitting: Radiation Oncology

## 2020-06-28 ENCOUNTER — Ambulatory Visit
Admission: RE | Admit: 2020-06-28 | Discharge: 2020-06-28 | Disposition: A | Discharge status: Home / Self Care | Payer: BLUE CROSS/BLUE SHIELD | Source: Ambulatory Visit | Attending: Radiation Oncology | Admitting: Radiation Oncology

## 2020-06-28 ENCOUNTER — Other Ambulatory Visit: Payer: Self-pay

## 2020-06-28 DIAGNOSIS — C07 Malignant neoplasm of parotid gland: Secondary | ICD-10-CM

## 2020-06-28 NOTE — Progress Notes (Signed)
Oncology Nurse Navigator Documentation  Met with patient during initial consult with Dr. Isidore Moos via [place] video conference.  . Further introduced myself as his Navigator, explained my role as a member of the Care Team.   . Provided introductory explanation of radiation treatment including SIM planning and purpose of Aquaplast head and shoulder mask, showed them example.   . I encouraged them to contact me with questions/concerns as treatments/procedures begin.  . I have contacted our financial advocates regarding Mr. Quakenbush's question about eligibility of patients insurance here at [facility].  They verbalized understanding of information provided.    Harlow Asa, RN, BSN, OCN Head & Neck Oncology Nurse [place] at [place] [phone]

## 2020-06-28 NOTE — Progress Notes (Signed)
Radiation Oncology         [phone] ________________________________  Initial outpatient Consultation by telephone.  The patient opted for telemedicine to maximize safety during the pandemic.  MyChart video was not obtainable.   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 male who presented with a right parotid mass for at least 5 years.  This slowly enlarged over time.  Over the last year he notes more rapid growth.  He also developed some pain in that region.    CT scan of neck was performed on Mar 29, 2020 at an outside center.  I personally reviewed his imaging, see snapshot below.  This revealed a 6 x 4 x 3 cm superficial right parotid mass with subcutaneous extension.  No adenopathy.    Ultimately he was seen by otolaryngology at [facility].  Dr. Nicolette Bang performed a superficial parotidectomy on 05/24/2020.  This revealed carcinoma ex pleomorphic adenoma.  Greatest dimension 7.0 cm.  1 benign lymph node was removed.  Margins were negative and less than 0.5 mm multifocally.  The pathology was confirmed by [facility].  No lymphovascular invasion.  No perineural invasion.   Nutrition Status Yes No Comments  Weight changes? []  [x]    Swallowing concerns? []  [x]    PEG? []  [x]      Safety Issues Yes No Comments  Prior radiation? []  [x]    Pacemaker/ICD? []  [x]    Possible current pregnancy? []  [x]    Is the patient on  methotrexate? []  [x]     Tobacco/Marijuana/Snuff/ETOH use: Former smoker. Smoked 1/2 pack daily until diagnosis. Occasionally drinks alcohol. No illicit drug use.   Current Complaints / other details: He is concerned that his insurance may not be in network with [facility]. He reports some postoperative numbness at the surgical site.   PREVIOUS RADIATION THERAPY: No  PAST MEDICAL HISTORY:  has a past medical history of Headache.    PAST SURGICAL HISTORY: Past Surgical History:  Procedure Laterality Date  . ANTERIOR CRUCIATE LIGAMENT REPAIR    . HERNIA REPAIR    . LEG SURGERY    . ROTATOR CUFF REPAIR    . SHOULDER ARTHROSCOPY WITH ROTATOR CUFF REPAIR AND SUBACROMIAL DECOMPRESSION Right 01/14/2016   Procedure: RIGHT SHOULDER ARTHROSCOPY WITH SUBACROMIAL DECOMPRESSION AND MINI OPEN ROTATOR CUFF REPAIR, BICEPS TENODESIS;  Surgeon: Meredith Pel, MD;  Location: [place];  Service: Orthopedics;  Laterality: Right;    FAMILY HISTORY: family history includes Cancer in his maternal aunt and maternal aunt; Hypertension in his mother.  SOCIAL HISTORY:  reports that he has quit smoking. His smoking use included cigarettes. He has a 10.00 pack-year smoking history. He has never used smokeless tobacco. He reports current alcohol use. He reports that he does not use drugs.  ALLERGIES: Patient has no known allergies.  MEDICATIONS:  Current Outpatient Medications  Medication Sig Dispense Refill  . atorvastatin (LIPITOR) 20 MG tablet Take 1 tablet by  mouth daily.    . sildenafil (REVATIO) 20 MG tablet Take 20 mg by mouth as needed.    Marland Kitchen oxyCODONE (OXY IR/ROXICODONE) 5 MG immediate release tablet Take 5 mg by mouth every 4 (four) hours as needed. (Patient not taking: Reported on 06/28/2020)     No current facility-administered medications for this encounter.    REVIEW OF SYSTEMS:  Notable for that above.   PHYSICAL EXAM:  vitals were not taken for this visit.   General: Alert and oriented, in  no acute distress     LABORATORY DATA:  Lab Results  Component Value Date   WBC 6.8 01/10/2016   HGB 16.4 01/10/2016   HCT 46.9 01/10/2016   MCV 90.4 01/10/2016   PLT 258 01/10/2016   CMP     Component Value Date/Time   NA 138 02/07/2015 1300   K 3.2 (L) 02/07/2015 1300   CL 113 (H) 02/07/2015 1300   CO2 19 02/07/2015 1300   GLUCOSE 84 02/07/2015 1300   BUN 24 (H) 02/07/2015 1300   CREATININE 0.97 02/07/2015 1300   CALCIUM 6.9 (L) 02/07/2015 1300   PROT 5.5 (L) 02/07/2015 1300   ALBUMIN 3.1 (L) 02/07/2015 1300   AST 16 02/07/2015 1300   ALT 15 02/07/2015 1300   ALKPHOS 69 02/07/2015 1300   BILITOT 0.4 02/07/2015 1300   GFRNONAA >90 02/07/2015 1300   GFRAA >90 02/07/2015 1300      No results found for: TSH   RADIOGRAPHY: See above in HPI  IMPRESSION/PLAN:  This is a delightful patient with 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  This was a large tumor with multiple close margins. I recommend approximately a month and a half of adjuvant radiotherapy for this patient.  We discussed the potential risks, benefits, and side effects of radiotherapy. We talked in detail about acute and late effects. We discussed that some of the most bothersome acute effects may be mucositis, dysgeusia, salivary changes, skin irritation, hair loss, dehydration, weight loss and fatigue. We talked about late effects which include but are not necessarily limited to xerostomia, hearing loss, nonhealing wounds, permanent tissue damage in the irradiated fields and neck edema. No guarantees of treatment were given.  I recommended a dental evaluation given the potential advantage of having scatter protection devices fabricated before radiation.  This could reduce scatter of radiation throughout his mouth and reduce the risk of acute mucositis and ulcers in the oral cavity.  He is also at risk for chronically dry mouth which can affect oral hygiene and health. (This will need  to be done through the [facility], at the discretion of his radiation oncologist -see below).   Following consultation, it was determined that the patient's insurance is out of network.  I notified Dr. Nicolette Bang by email.  I recommend that he see another radiation oncologist, Dr. Pablo Ledger, who is associated with [facility] and is in his network.  We will facilitate that referral.  Of note: We discussed measures to reduce the risk of infection during the COVID-19 pandemic.  I asked if he has been vaccinated.  He has not.  We had a very lengthy discussion about the safety of the vaccine and the significant risk of COVID-19.  We talked about the extremely contagious nature of the delta variant.  We discussed that if he catches Covid during his adjuvant therapy, this could complicate his treatment.  At this time the patient continues to decline vaccine, and expresses skepticism  about it.  I gave him and his significant other information on how to schedule vaccine if and when they feel ready.  On date of service, in total, I spent 60 minutes on this encounter.  This encounter was provided by telemedicine platform by telephone.  The patient opted for telemedicine to maximize safety during the pandemic.  MyChart video was not obtainable. The patient has given verbal consent for this type of encounter and has been advised to only accept a meeting of this type in a secure network environment. The attendants for this meeting include Eppie Gibson  and Shanda Bumps.  During the encounter, Eppie Gibson was located at [facility].  Shanda Bumps was located at home.   __________________________________________   Eppie Gibson, MD

## 2020-06-29 ENCOUNTER — Telehealth: Payer: Self-pay | Admitting: *Deleted

## 2020-06-29 ENCOUNTER — Other Ambulatory Visit: Payer: Self-pay

## 2020-06-29 ENCOUNTER — Telehealth: Payer: Self-pay | Admitting: Radiation Oncology

## 2020-06-29 DIAGNOSIS — C07 Malignant neoplasm of parotid gland: Secondary | ICD-10-CM

## 2020-06-29 NOTE — Progress Notes (Signed)
Oncology Nurse Navigator Documentation  Andres Howard consulted with Dr. Isidore Moos 8/23 after being referred by Dr. Nicolette Bang at [facility]. During the consult his significant other questioned his insurance coverage and if it would cover treatment at [facility]. His insurance was reviewed by our financial specialist and we learned that he would need to receive treatment at a [facility] facility for his insurance to be in network. I have placed a referral to Andres Howard, Radiation Oncologist in [facility] at Dr. Pearlie Howard request. I have notified Andres Howard that he should be receiving a phone call with a new appointment at [facility]. He was provided my direct contact information should he have any further questions or concerns.  Harlow Asa RN, BSN, OCN Head & Neck Oncology Nurse [place] at [facility] Phone # [phone]  Fax # [phone]

## 2020-06-29 NOTE — Progress Notes (Signed)
Oncology Nurse Navigator Documentation  Andres Howard has been scheduled to see Dr. Pablo Ledger at [facility] in [place] on 07/09/20 at 10:00. He is aware and agreeable to the appointment.  Harlow Asa RN, BSN, OCN Head & Neck Oncology Nurse [place] at [facility] Phone # [phone]  Fax # [phone]

## 2020-06-29 NOTE — Telephone Encounter (Signed)
Per 8/23 sch msg - pt says referral sent back to surgeon - no need to schedule here.

## 2020-06-29 NOTE — Telephone Encounter (Signed)
Called patient to inform of consultation with Dr. Pablo Ledger on 07-09-20 - arrival time- 9:30 am, spoke with patient and he is aware of this appt.

## 2020-07-02 ENCOUNTER — Encounter: Payer: Self-pay | Admitting: Radiation Oncology

## 2020-07-02 DIAGNOSIS — C07 Malignant neoplasm of parotid gland: Secondary | ICD-10-CM | POA: Insufficient documentation
